# Patient Record
Sex: Female | Born: 1965 | Race: Black or African American | Hispanic: No | Smoking: Current every day smoker
Health system: Southern US, Community
[De-identification: ages and names within clinical notes are randomized; demographics above are authoritative.]

## PROBLEM LIST (undated history)

## (undated) DIAGNOSIS — E785 Hyperlipidemia, unspecified: Secondary | ICD-10-CM

## (undated) DIAGNOSIS — Z972 Presence of dental prosthetic device (complete) (partial): Secondary | ICD-10-CM

## (undated) DIAGNOSIS — F17200 Nicotine dependence, unspecified, uncomplicated: Secondary | ICD-10-CM

## (undated) DIAGNOSIS — Z9289 Personal history of other medical treatment: Secondary | ICD-10-CM

## (undated) DIAGNOSIS — M1990 Unspecified osteoarthritis, unspecified site: Secondary | ICD-10-CM

## (undated) DIAGNOSIS — T7840XA Allergy, unspecified, initial encounter: Secondary | ICD-10-CM

## (undated) DIAGNOSIS — Z01419 Encounter for gynecological examination (general) (routine) without abnormal findings: Secondary | ICD-10-CM

## (undated) DIAGNOSIS — I10 Essential (primary) hypertension: Secondary | ICD-10-CM

## (undated) DIAGNOSIS — Z973 Presence of spectacles and contact lenses: Secondary | ICD-10-CM

## (undated) DIAGNOSIS — D649 Anemia, unspecified: Secondary | ICD-10-CM

## (undated) DIAGNOSIS — C801 Malignant (primary) neoplasm, unspecified: Secondary | ICD-10-CM

## (undated) DIAGNOSIS — K219 Gastro-esophageal reflux disease without esophagitis: Secondary | ICD-10-CM

## (undated) DIAGNOSIS — E039 Hypothyroidism, unspecified: Secondary | ICD-10-CM

## (undated) DIAGNOSIS — Z8639 Personal history of other endocrine, nutritional and metabolic disease: Secondary | ICD-10-CM

## (undated) DIAGNOSIS — Z803 Family history of malignant neoplasm of breast: Secondary | ICD-10-CM

## (undated) DIAGNOSIS — R87619 Unspecified abnormal cytological findings in specimens from cervix uteri: Secondary | ICD-10-CM

## (undated) DIAGNOSIS — E782 Mixed hyperlipidemia: Secondary | ICD-10-CM

## (undated) DIAGNOSIS — R002 Palpitations: Secondary | ICD-10-CM

## (undated) HISTORY — DX: Allergy, unspecified, initial encounter: T78.40XA

## (undated) HISTORY — DX: Personal history of other medical treatment: Z92.89

## (undated) HISTORY — DX: Personal history of other endocrine, nutritional and metabolic disease: Z86.39

## (undated) HISTORY — DX: Presence of dental prosthetic device (complete) (partial): Z97.2

## (undated) HISTORY — DX: Presence of spectacles and contact lenses: Z97.3

## (undated) HISTORY — DX: Anemia, unspecified: D64.9

## (undated) HISTORY — DX: Hyperlipidemia, unspecified: E78.5

## (undated) HISTORY — DX: Nicotine dependence, unspecified, uncomplicated: F17.200

## (undated) HISTORY — DX: Family history of malignant neoplasm of breast: Z80.3

## (undated) HISTORY — PX: SHOULDER SURGERY: SHX246

## (undated) HISTORY — PX: FACIAL RECONSTRUCTION SURGERY: SHX631

## (undated) HISTORY — DX: Palpitations: R00.2

## (undated) HISTORY — DX: Unspecified abnormal cytological findings in specimens from cervix uteri: R87.619

## (undated) HISTORY — DX: Hypothyroidism, unspecified: E03.9

## (undated) HISTORY — DX: Gastro-esophageal reflux disease without esophagitis: K21.9

## (undated) HISTORY — DX: Mixed hyperlipidemia: E78.2

## (undated) HISTORY — DX: Unspecified osteoarthritis, unspecified site: M19.90

## (undated) HISTORY — DX: Encounter for gynecological examination (general) (routine) without abnormal findings: Z01.419

---

## 2008-01-02 ENCOUNTER — Emergency Department (HOSPITAL_COMMUNITY): Admission: EM | Admit: 2008-01-02 | Discharge: 2008-01-02 | Payer: Self-pay | Admitting: Emergency Medicine

## 2008-06-18 ENCOUNTER — Emergency Department (HOSPITAL_COMMUNITY): Admission: EM | Admit: 2008-06-18 | Discharge: 2008-06-18 | Payer: Self-pay | Admitting: Emergency Medicine

## 2008-08-26 ENCOUNTER — Encounter (INDEPENDENT_AMBULATORY_CARE_PROVIDER_SITE_OTHER): Payer: Self-pay | Admitting: Nurse Practitioner

## 2008-10-07 ENCOUNTER — Encounter: Admission: RE | Admit: 2008-10-07 | Discharge: 2008-10-07 | Payer: Self-pay | Admitting: Family Medicine

## 2008-10-24 ENCOUNTER — Emergency Department (HOSPITAL_COMMUNITY): Admission: EM | Admit: 2008-10-24 | Discharge: 2008-10-24 | Payer: Self-pay | Admitting: Family Medicine

## 2008-11-07 ENCOUNTER — Encounter (INDEPENDENT_AMBULATORY_CARE_PROVIDER_SITE_OTHER): Payer: Self-pay | Admitting: Nurse Practitioner

## 2008-11-18 ENCOUNTER — Ambulatory Visit: Payer: Self-pay | Admitting: Internal Medicine

## 2008-11-18 ENCOUNTER — Encounter (INDEPENDENT_AMBULATORY_CARE_PROVIDER_SITE_OTHER): Payer: Self-pay | Admitting: Nurse Practitioner

## 2008-11-18 DIAGNOSIS — N63 Unspecified lump in unspecified breast: Secondary | ICD-10-CM | POA: Insufficient documentation

## 2008-11-18 DIAGNOSIS — J45909 Unspecified asthma, uncomplicated: Secondary | ICD-10-CM | POA: Insufficient documentation

## 2008-11-18 DIAGNOSIS — Z8639 Personal history of other endocrine, nutritional and metabolic disease: Secondary | ICD-10-CM | POA: Insufficient documentation

## 2008-11-18 DIAGNOSIS — M6740 Ganglion, unspecified site: Secondary | ICD-10-CM | POA: Insufficient documentation

## 2008-11-18 DIAGNOSIS — E039 Hypothyroidism, unspecified: Secondary | ICD-10-CM | POA: Insufficient documentation

## 2008-11-18 DIAGNOSIS — F17200 Nicotine dependence, unspecified, uncomplicated: Secondary | ICD-10-CM | POA: Insufficient documentation

## 2008-11-20 DIAGNOSIS — D649 Anemia, unspecified: Secondary | ICD-10-CM | POA: Insufficient documentation

## 2008-11-20 LAB — CONVERTED CEMR LAB
ALT: 8 units/L (ref 0–35)
AST: 10 units/L (ref 0–37)
Albumin: 4.3 g/dL (ref 3.5–5.2)
Alkaline Phosphatase: 48 units/L (ref 39–117)
Basophils Absolute: 0 10*3/uL (ref 0.0–0.1)
Eosinophils Absolute: 0.1 10*3/uL (ref 0.0–0.7)
Eosinophils Relative: 2 % (ref 0–5)
Glucose, Bld: 94 mg/dL (ref 70–99)
HCT: 36.6 % (ref 36.0–46.0)
Lymphs Abs: 1.8 10*3/uL (ref 0.7–4.0)
MCV: 93.4 fL (ref 78.0–100.0)
Neutrophils Relative %: 58 % (ref 43–77)
Platelets: 175 10*3/uL (ref 150–400)
Potassium: 5 meq/L (ref 3.5–5.3)
RDW: 13.6 % (ref 11.5–15.5)
Sodium: 146 meq/L — ABNORMAL HIGH (ref 135–145)
Total Bilirubin: 0.3 mg/dL (ref 0.3–1.2)
Total Protein: 6.6 g/dL (ref 6.0–8.3)
WBC: 5.5 10*3/uL (ref 4.0–10.5)

## 2009-02-17 ENCOUNTER — Telehealth (INDEPENDENT_AMBULATORY_CARE_PROVIDER_SITE_OTHER): Payer: Self-pay | Admitting: Nurse Practitioner

## 2009-02-24 ENCOUNTER — Emergency Department (HOSPITAL_COMMUNITY): Admission: EM | Admit: 2009-02-24 | Discharge: 2009-02-24 | Payer: Self-pay | Admitting: Emergency Medicine

## 2009-03-24 ENCOUNTER — Encounter (INDEPENDENT_AMBULATORY_CARE_PROVIDER_SITE_OTHER): Payer: Self-pay | Admitting: Nurse Practitioner

## 2009-03-24 ENCOUNTER — Ambulatory Visit: Payer: Self-pay | Admitting: Nurse Practitioner

## 2009-03-24 ENCOUNTER — Other Ambulatory Visit: Admission: RE | Admit: 2009-03-24 | Discharge: 2009-03-24 | Payer: Self-pay | Admitting: Family Medicine

## 2009-03-24 DIAGNOSIS — N760 Acute vaginitis: Secondary | ICD-10-CM | POA: Insufficient documentation

## 2009-03-24 LAB — CONVERTED CEMR LAB
Bilirubin Urine: NEGATIVE
Blood in Urine, dipstick: NEGATIVE
Glucose, Urine, Semiquant: NEGATIVE
KOH Prep: NEGATIVE
Ketones, urine, test strip: NEGATIVE
Protein, U semiquant: NEGATIVE
Urobilinogen, UA: 0.2

## 2009-03-25 ENCOUNTER — Encounter (INDEPENDENT_AMBULATORY_CARE_PROVIDER_SITE_OTHER): Payer: Self-pay | Admitting: Nurse Practitioner

## 2009-03-25 LAB — CONVERTED CEMR LAB
ALT: 10 units/L (ref 0–35)
Albumin: 4.7 g/dL (ref 3.5–5.2)
Alkaline Phosphatase: 47 units/L (ref 39–117)
Basophils Absolute: 0 10*3/uL (ref 0.0–0.1)
Basophils Relative: 1 % (ref 0–1)
CO2: 22 meq/L (ref 19–32)
Chlamydia, DNA Probe: NEGATIVE
GC Probe Amp, Genital: NEGATIVE
Hemoglobin: 13.1 g/dL (ref 12.0–15.0)
LDL Cholesterol: 115 mg/dL — ABNORMAL HIGH (ref 0–99)
Lymphocytes Relative: 28 % (ref 12–46)
MCHC: 33 g/dL (ref 30.0–36.0)
Monocytes Relative: 8 % (ref 3–12)
Neutro Abs: 3.5 10*3/uL (ref 1.7–7.7)
Neutrophils Relative %: 62 % (ref 43–77)
Potassium: 4.4 meq/L (ref 3.5–5.3)
Preg, Serum: POSITIVE
RBC: 4.37 M/uL (ref 3.87–5.11)
Sodium: 141 meq/L (ref 135–145)
Total Bilirubin: 0.6 mg/dL (ref 0.3–1.2)
Total Protein: 7 g/dL (ref 6.0–8.3)
VLDL: 19 mg/dL (ref 0–40)

## 2009-03-26 ENCOUNTER — Encounter (INDEPENDENT_AMBULATORY_CARE_PROVIDER_SITE_OTHER): Payer: Self-pay | Admitting: Nurse Practitioner

## 2009-03-28 ENCOUNTER — Inpatient Hospital Stay (HOSPITAL_COMMUNITY): Admission: AD | Admit: 2009-03-28 | Discharge: 2009-03-28 | Payer: Self-pay | Admitting: Obstetrics & Gynecology

## 2009-03-30 ENCOUNTER — Inpatient Hospital Stay (HOSPITAL_COMMUNITY): Admission: AD | Admit: 2009-03-30 | Discharge: 2009-03-30 | Payer: Self-pay | Admitting: Family Medicine

## 2009-04-01 ENCOUNTER — Inpatient Hospital Stay (HOSPITAL_COMMUNITY): Admission: AD | Admit: 2009-04-01 | Discharge: 2009-04-01 | Payer: Self-pay | Admitting: Obstetrics & Gynecology

## 2009-04-11 ENCOUNTER — Inpatient Hospital Stay (HOSPITAL_COMMUNITY): Admission: RE | Admit: 2009-04-11 | Discharge: 2009-04-11 | Payer: Self-pay | Admitting: Family Medicine

## 2009-05-07 ENCOUNTER — Ambulatory Visit: Payer: Self-pay | Admitting: Obstetrics and Gynecology

## 2009-05-08 ENCOUNTER — Encounter: Payer: Self-pay | Admitting: Obstetrics and Gynecology

## 2009-05-15 ENCOUNTER — Ambulatory Visit: Payer: Self-pay | Admitting: Obstetrics and Gynecology

## 2009-05-16 ENCOUNTER — Ambulatory Visit (HOSPITAL_COMMUNITY): Admission: RE | Admit: 2009-05-16 | Discharge: 2009-05-16 | Payer: Self-pay | Admitting: Family Medicine

## 2009-05-16 ENCOUNTER — Encounter: Payer: Self-pay | Admitting: Obstetrics and Gynecology

## 2009-05-19 ENCOUNTER — Ambulatory Visit: Payer: Self-pay | Admitting: Obstetrics & Gynecology

## 2009-05-19 ENCOUNTER — Ambulatory Visit (HOSPITAL_COMMUNITY): Admission: RE | Admit: 2009-05-19 | Discharge: 2009-05-19 | Payer: Self-pay | Admitting: Obstetrics & Gynecology

## 2009-05-19 ENCOUNTER — Encounter: Payer: Self-pay | Admitting: Obstetrics & Gynecology

## 2009-05-21 ENCOUNTER — Encounter (INDEPENDENT_AMBULATORY_CARE_PROVIDER_SITE_OTHER): Payer: Self-pay | Admitting: Nurse Practitioner

## 2009-05-22 ENCOUNTER — Encounter: Admission: RE | Admit: 2009-05-22 | Discharge: 2009-05-22 | Payer: Self-pay | Admitting: Internal Medicine

## 2009-06-02 ENCOUNTER — Encounter (INDEPENDENT_AMBULATORY_CARE_PROVIDER_SITE_OTHER): Payer: Self-pay | Admitting: Nurse Practitioner

## 2009-06-02 DIAGNOSIS — O034 Incomplete spontaneous abortion without complication: Secondary | ICD-10-CM | POA: Insufficient documentation

## 2009-10-17 ENCOUNTER — Emergency Department (HOSPITAL_COMMUNITY): Admission: EM | Admit: 2009-10-17 | Discharge: 2009-10-17 | Payer: Self-pay | Admitting: Family Medicine

## 2009-10-24 ENCOUNTER — Encounter (INDEPENDENT_AMBULATORY_CARE_PROVIDER_SITE_OTHER): Payer: Self-pay | Admitting: Nurse Practitioner

## 2009-10-29 ENCOUNTER — Encounter: Admission: RE | Admit: 2009-10-29 | Discharge: 2009-10-29 | Payer: Self-pay | Admitting: Internal Medicine

## 2009-10-31 ENCOUNTER — Ambulatory Visit: Payer: Self-pay | Admitting: Nurse Practitioner

## 2009-10-31 DIAGNOSIS — B351 Tinea unguium: Secondary | ICD-10-CM | POA: Insufficient documentation

## 2009-10-31 DIAGNOSIS — N926 Irregular menstruation, unspecified: Secondary | ICD-10-CM | POA: Insufficient documentation

## 2009-11-17 LAB — CONVERTED CEMR LAB
ALT: 9 units/L (ref 0–35)
AST: 12 units/L (ref 0–37)
Alkaline Phosphatase: 46 units/L (ref 39–117)
Indirect Bilirubin: 0.4 mg/dL (ref 0.0–0.9)
Total Protein: 6.6 g/dL (ref 6.0–8.3)

## 2010-01-13 ENCOUNTER — Telehealth (INDEPENDENT_AMBULATORY_CARE_PROVIDER_SITE_OTHER): Payer: Self-pay | Admitting: Nurse Practitioner

## 2010-01-17 ENCOUNTER — Emergency Department (HOSPITAL_COMMUNITY): Admission: EM | Admit: 2010-01-17 | Discharge: 2010-01-17 | Payer: Self-pay | Admitting: Family Medicine

## 2010-01-19 ENCOUNTER — Encounter (INDEPENDENT_AMBULATORY_CARE_PROVIDER_SITE_OTHER): Payer: Self-pay | Admitting: *Deleted

## 2010-04-12 ENCOUNTER — Emergency Department (HOSPITAL_COMMUNITY): Admission: EM | Admit: 2010-04-12 | Discharge: 2010-04-12 | Payer: Self-pay | Admitting: Family Medicine

## 2010-05-20 IMAGING — US US OB TRANSVAGINAL
1 series · 14 of 26 positions shown · non-contrast
Comparison: none

OBSTETRICAL ULTRASOUND:
 This ultrasound exam was performed in the [HOSPITAL] Ultrasound Department.  The OB US report was generated in the AS system, and faxed to the ordering physician.  This report is also available in [REDACTED] PACS.

[Series 1: us ob comp less 14 wks · 0.12mm/px · 14 of 26 slices shown]
[im 1/26]
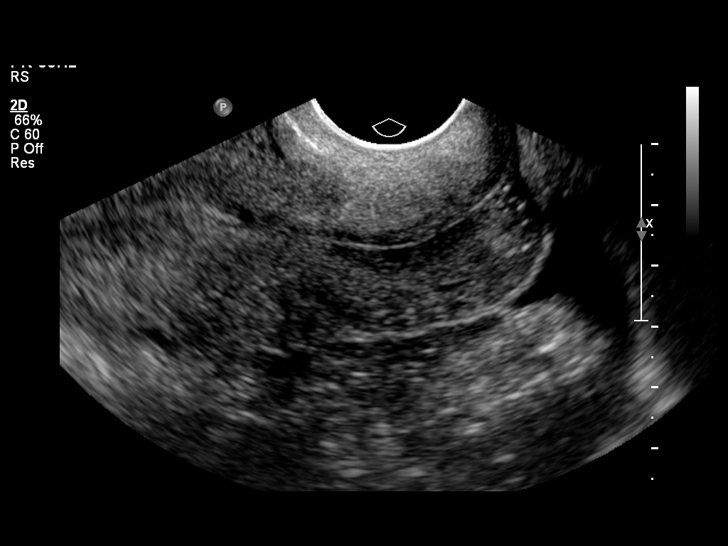
[im 3/26]
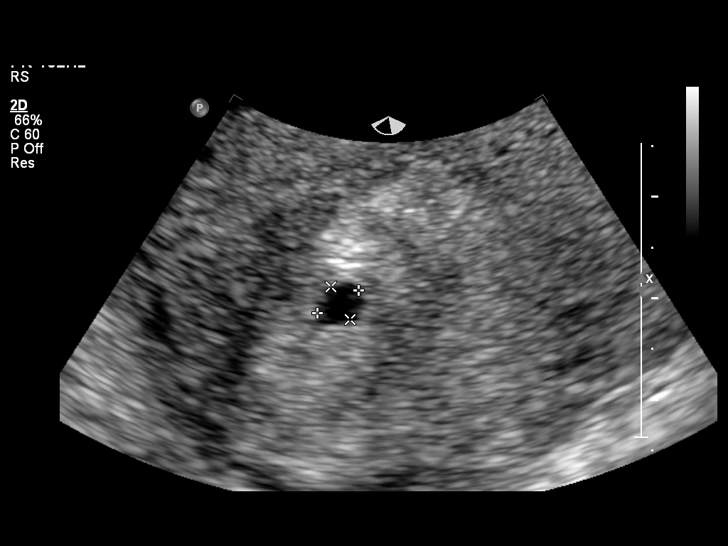
[im 5/26]
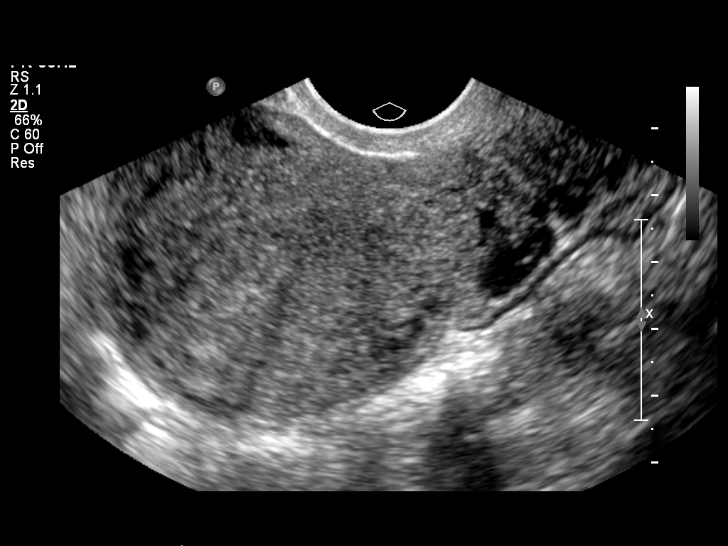
[im 7/26]
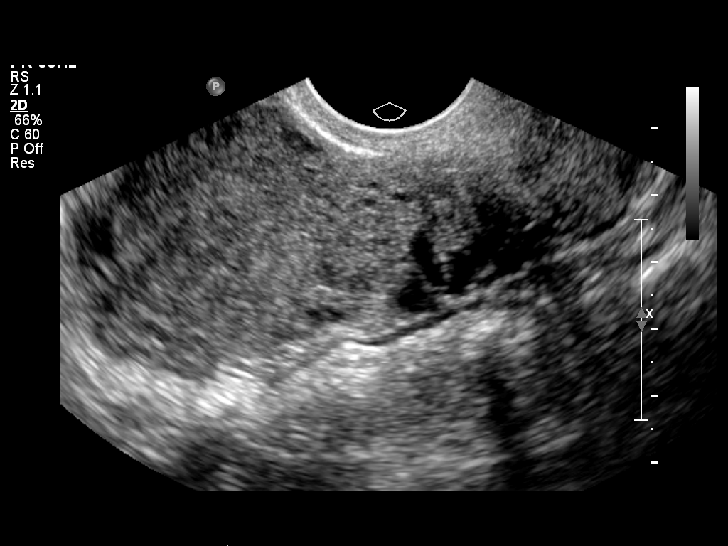
[im 9/26]
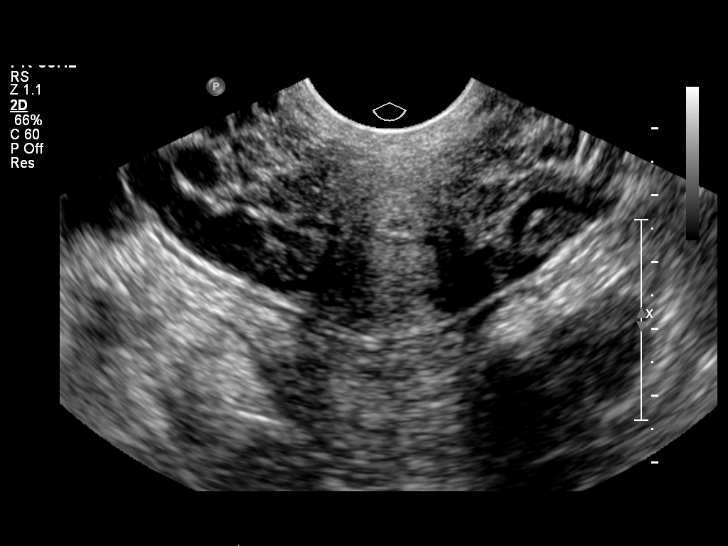
[im 11/26]
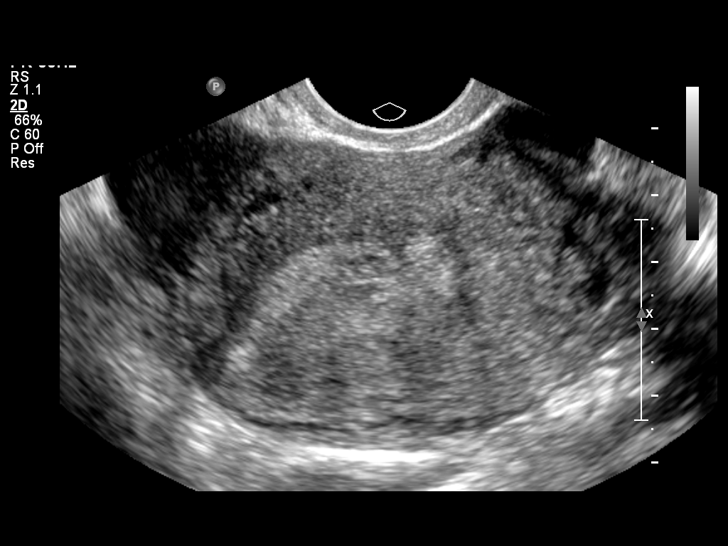
[im 13/26]
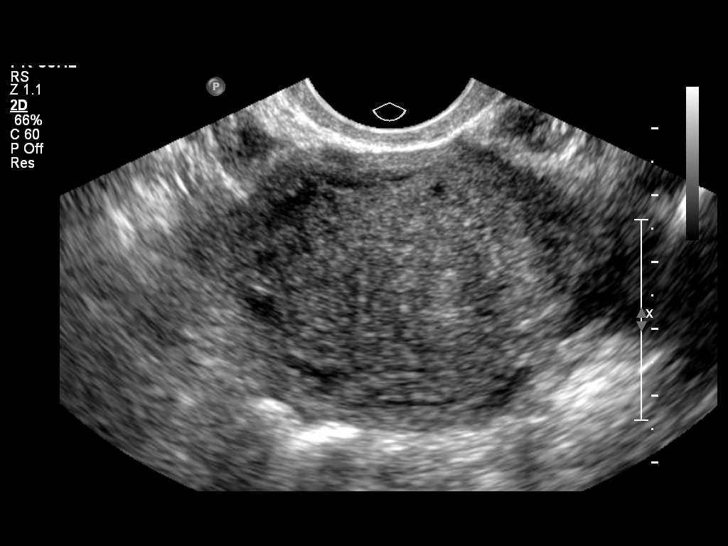
[im 14/26]
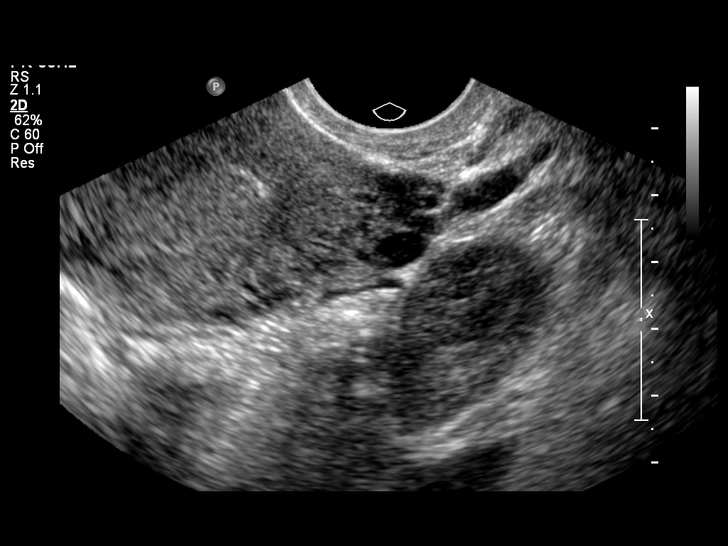
[im 16/26]
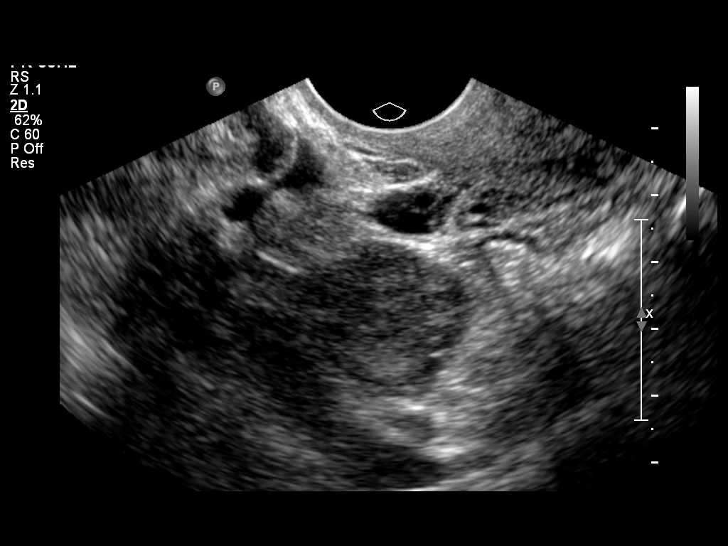
[im 18/26]
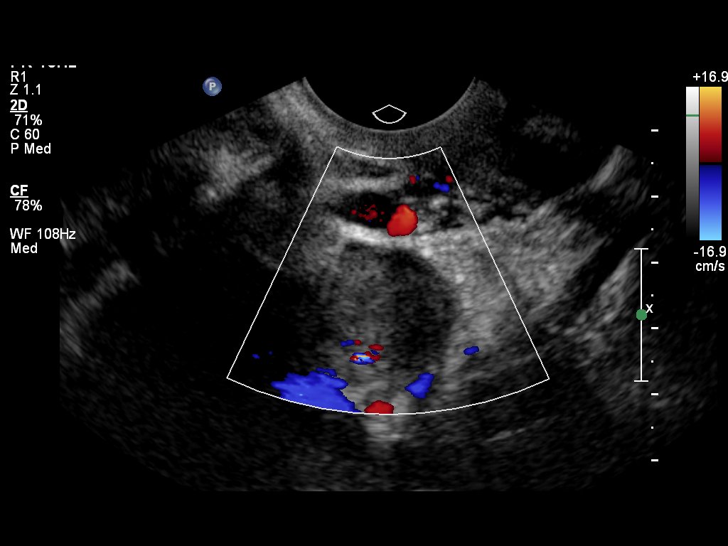
[im 20/26]
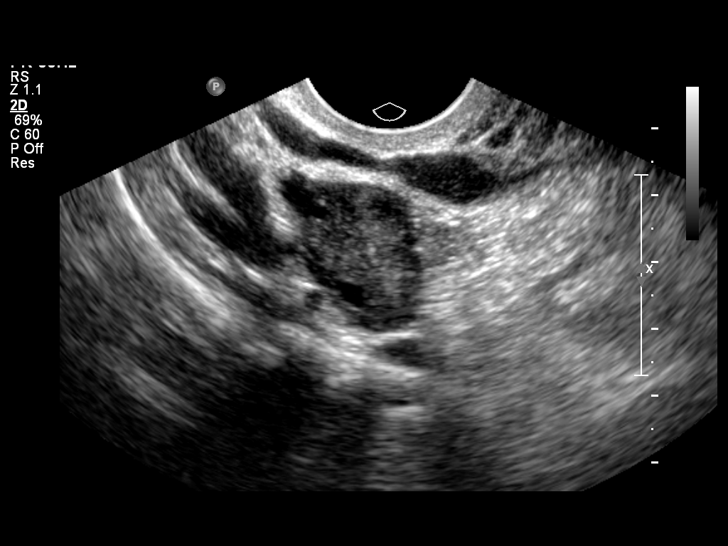
[im 22/26]
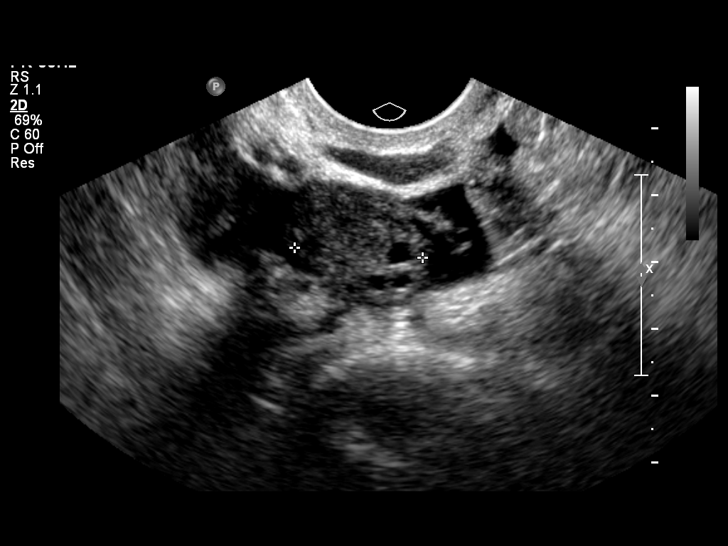
[im 24/26]
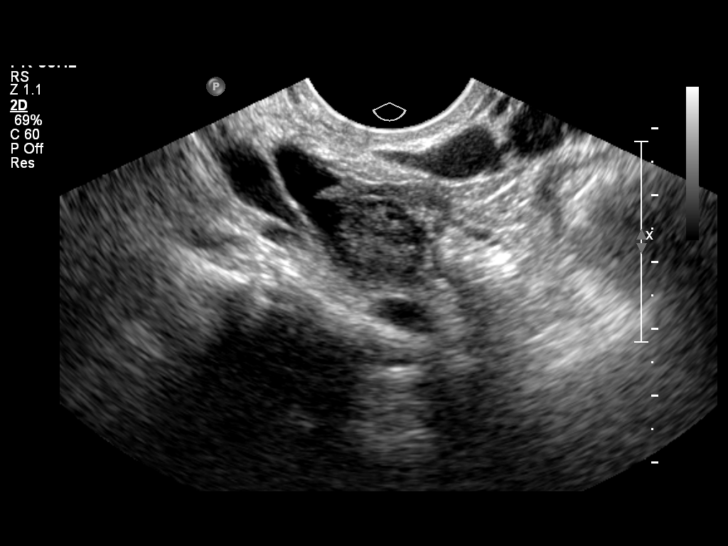
[im 26/26]
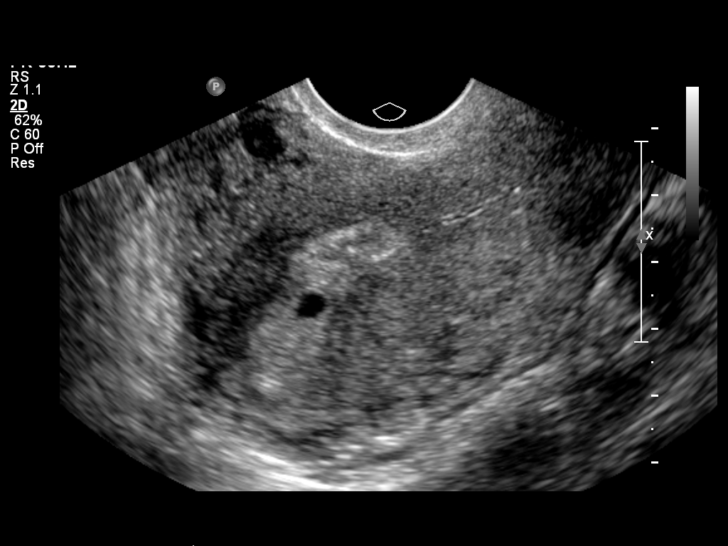

[14 of 26 positions shown; findings below may reference images not displayed]

IMPRESSION: See AS Obstetric US report.

## 2010-05-31 ENCOUNTER — Emergency Department (HOSPITAL_COMMUNITY): Admission: EM | Admit: 2010-05-31 | Discharge: 2010-05-31 | Payer: Self-pay | Admitting: Emergency Medicine

## 2010-06-17 ENCOUNTER — Telehealth (INDEPENDENT_AMBULATORY_CARE_PROVIDER_SITE_OTHER): Payer: Self-pay | Admitting: Nurse Practitioner

## 2010-06-18 ENCOUNTER — Ambulatory Visit: Payer: Self-pay | Admitting: Nurse Practitioner

## 2010-06-22 ENCOUNTER — Telehealth (INDEPENDENT_AMBULATORY_CARE_PROVIDER_SITE_OTHER): Payer: Self-pay | Admitting: Nurse Practitioner

## 2010-06-22 ENCOUNTER — Encounter (INDEPENDENT_AMBULATORY_CARE_PROVIDER_SITE_OTHER): Payer: Self-pay | Admitting: Nurse Practitioner

## 2010-10-10 ENCOUNTER — Emergency Department (HOSPITAL_COMMUNITY): Admission: EM | Admit: 2010-10-10 | Discharge: 2010-10-10 | Payer: Self-pay | Admitting: Emergency Medicine

## 2010-11-29 ENCOUNTER — Emergency Department (HOSPITAL_COMMUNITY)
Admission: EM | Admit: 2010-11-29 | Discharge: 2010-11-29 | Payer: Self-pay | Source: Home / Self Care | Admitting: Emergency Medicine

## 2010-12-15 ENCOUNTER — Emergency Department (HOSPITAL_COMMUNITY)
Admission: EM | Admit: 2010-12-15 | Discharge: 2010-12-15 | Payer: Self-pay | Source: Home / Self Care | Admitting: Emergency Medicine

## 2010-12-22 ENCOUNTER — Emergency Department (HOSPITAL_COMMUNITY)
Admission: EM | Admit: 2010-12-22 | Discharge: 2010-12-22 | Payer: Self-pay | Source: Home / Self Care | Admitting: Family Medicine

## 2011-01-17 ENCOUNTER — Encounter: Payer: Self-pay | Admitting: Internal Medicine

## 2011-01-19 ENCOUNTER — Telehealth (INDEPENDENT_AMBULATORY_CARE_PROVIDER_SITE_OTHER): Payer: Self-pay | Admitting: Nurse Practitioner

## 2011-01-26 NOTE — Progress Notes (Signed)
Summary: LAB RESULTS /NEEDS REFILLS  Phone Note Call from Patient   Reason for Call: Refill Medication, Lab or Test Results Summary of Call: PT WANTS TO KNOW HER LAB RESULTS .Megan Bullock, CALL PT [phone] THANK YOU  Initial call taken by: Cheryll Dessert,  June 22, 2010 3:01 PM  Follow-up for Phone Call        Megan Bullock CALLED AND SAYS THAT SHE WILL NEED A REFILL ON HER LEXOXYL AND VENTOLIN, CALLED INTO [facility] ON [address], BECAUSE HER MEDICAID RUNS OUT TODAY. Follow-up by: Leodis Rains,  June 25, 2010 10:23 AM  Additional Follow-up for Phone Call Additional follow up Details #1::        levoxyl last filled 02/09/10 ventolin last filled 10/31/2009 Forward to N. Daphine Deutscher, fnp Additional Follow-up by: Levon Hedger,  June 25, 2010 12:14 PM    Additional Follow-up for Phone Call Additional follow up Details #2::    Rx sent to pharmacy notify pt to check there Follow-up by: Lehman Prom FNP,  June 25, 2010 1:07 PM  Additional Follow-up for Phone Call Additional follow up Details #3:: Details for Additional Follow-up Action Taken: pt informed. Additional Follow-up by: Levon Hedger,  July 01, 2010 11:53 AM  New/Updated Medications: VENTOLIN HFA 108 (90 BASE) MCG/ACT AERS (ALBUTEROL SULFATE) Two inhalations every 6 hours as needed for shortness of breath LEVOXYL 150 MCG TABS (LEVOTHYROXINE SODIUM) One tablet by mouth daily for thyroid  Brand name is medically necessary [BMN] Prescriptions: VENTOLIN HFA 108 (90 BASE) MCG/ACT AERS (ALBUTEROL SULFATE) Two inhalations every 6 hours as needed for shortness of breath  #1 x 3   Entered and Authorized by:   Lehman Prom FNP   Signed by:   Lehman Prom FNP on 06/25/2010   Method used:   Electronically to        [facility] [address] [phone]* (retail)       [address]       [place], [place]  [address]       Ph: [phone] or [phone]       Fax: [phone]   RxID:   [id number] LEVOXYL  150 MCG TABS (LEVOTHYROXINE SODIUM) One tablet by mouth daily for thyroid  Brand name is medically necessary Brand medically necessary #30 x 5   Entered and Authorized by:   Lehman Prom FNP   Signed by:   Lehman Prom FNP on 06/25/2010   Method used:   Electronically to        [facility] [address] [phone]* (retail)       [address]       [place], [place]  [address]       Ph: [phone] or [phone]       Fax: [phone]   RxID:   [id number]

## 2011-01-26 NOTE — Letter (Signed)
Summary: *HSN Results Follow up  [facility]  [address] [place], [place] [address]   Phone: [phone]  Fax: [phone]      01/19/2010   [facility] [place] [address] [place], [place]  [address]   Dear  Ms. Rily Kruk,                            ____S.Drinkard,FNP   ____D. Gore,FNP       ____B. McPherson,MD   ____V. Rankins,MD    ____E. Mulberry,MD    ____N. Daphine Deutscher, FNP  ____D. Reche Dixon, MD    ____K. Philipp Deputy, MD    ____Other     This letter is to inform you that your recent test(s):  _______Pap Smear    _______Lab Test     _______X-ray    _______ is within acceptable limits  _______ requires a medication change  _______ requires a follow-up lab visit  _______ requires a follow-up visit with your provider   Comments:  We have tried contacting you at [phone] about your thyroid lab results.  Please contact the office at your earliest convenience.       _________________________________________________________ If you have any questions, please contact our office                     Sincerely,  Levon Hedger [facility]

## 2011-01-26 NOTE — Letter (Signed)
Summary: *HSN Results Follow up  [facility]  [address] [place], [place] [address]   Phone: [phone]  Fax: [phone]      06/22/2010   [facility] [place] [address] [place], [place]  [address]   Dear  Ms. Madalyn Swartzlander,                            ____S.Drinkard,FNP   ____D. Gore,FNP       ____B. McPherson,MD   ____V. Rankins,MD    ____E. Mulberry,MD    __X__N. Daphine Deutscher, FNP  ____D. Reche Dixon, MD    ____K. Philipp Deputy, MD    ____Other     This letter is to inform you that your recent test(s):  _______Pap Smear    ____X___Lab Test     _______X-ray    ___X____ is within acceptable limits  _______ requires a medication change  _______ requires a follow-up lab visit  _______ requires a follow-up visit with your provider   Comments:  Thyroid labs ok, continue current medication.       _________________________________________________________ If you have any questions, please contact our office [phone].                    Sincerely,    Lehman Prom FNP [facility]

## 2011-01-26 NOTE — Progress Notes (Signed)
Summary: ? about labs  Phone Note Other Incoming   Summary of Call: Pt has requested refills on thyroid meds.  will not refill. This office tried to reach pt back in 10/2009 after labs.  she was mailed a letter but I don't see she every called back.   re-attempt to reach pt give info in last phone note Initial call taken by: Lehman Prom FNP,  January 13, 2010 10:27 AM  Follow-up for Phone Call        left message on answer machine for pt. to return call  Gaylyn Cheers RN  January 13, 2010 1:17 PM   Levon Hedger  January 15, 2010 12:19 AM Left message on machine for pt to return call to the office.  Levon Hedger  January 19, 2010 2:21 PM Left message at [phone] for pt to return call to the office.  Will Astronomer.

## 2011-01-26 NOTE — Progress Notes (Signed)
Summary: Needs a lab test before her insurance expired (June 30,2011)  Phone Note Call from Patient Call back at [phone]   Summary of Call: The pt needs to do a thyroid lab test but it needs to be before her insurance runs out (June 25, 2010) [facility] Initial call taken by: Manon Hilding,  June 17, 2010 10:24 AM  Follow-up for Phone Call        last TSH drawn was 10/31/09 forward to N. Daphine Deutscher, FNP Follow-up by: Levon Hedger,  June 17, 2010 11:18 AM  Additional Follow-up for Phone Call Additional follow up Details #1::        CPE is past due - Is there any room for a CPE in my schedule before June 30th? If not, yes pt can get lab visit for TSH as last level was very abnormal Additional Follow-up by: Lehman Prom FNP,  June 17, 2010 12:42 PM    Additional Follow-up for Phone Call Additional follow up Details #2::    Pt. in for labs 06/18/10.  Dutch Quint RN  June 18, 2010 4:34 PM

## 2011-01-28 NOTE — Progress Notes (Signed)
Summary: NEEDS MAMOGRAM REF  Phone Note Call from Patient Call back at Home Phone [phone]   Reason for Call: Referral Summary of Call: MARTIBN PT. MS [place] GTRIED TO CALL THE [facility] ON [address] TO GET HER MAMOGRAM APPT. AND THEY TOLD HER THAT SHE NEEDS A REFERRAL FROM [place]. Initial call taken by: Leodis Rains,  January 19, 2011 9:31 AM  Follow-up for Phone Call        Sent to N. Daphine Deutscher.  Dutch Quint RN  January 19, 2011 3:31 PM   Additional Follow-up for Phone Call Additional follow up Details #1::        pt has not been seen in this office since 2010 she needs an office visit before authorizing any tests.  she can schedule cpe with fasting labs and pap, mammogram will be ordered Additional Follow-up by: Lehman Prom FNP,  January 19, 2011 6:02 PM    Additional Follow-up for Phone Call Additional follow up Details #2::    Left message on voicemail for pt. to return call.  Dutch Quint RN  January 20, 2011 11:24 AM  Left message on voicemail for pt. to return call.  Dutch Quint RN  January 21, 2011 10:57 AM  Pt. advised of provider's response -- to call back for CPP.  Dutch Quint RN  January 22, 2011 4:17 PM   `

## 2011-02-05 ENCOUNTER — Ambulatory Visit (INDEPENDENT_AMBULATORY_CARE_PROVIDER_SITE_OTHER): Payer: 59 | Admitting: Family Medicine

## 2011-02-05 DIAGNOSIS — E039 Hypothyroidism, unspecified: Secondary | ICD-10-CM

## 2011-02-05 DIAGNOSIS — Z803 Family history of malignant neoplasm of breast: Secondary | ICD-10-CM

## 2011-02-05 DIAGNOSIS — Z0000 Encounter for general adult medical examination without abnormal findings: Secondary | ICD-10-CM

## 2011-02-05 DIAGNOSIS — J45909 Unspecified asthma, uncomplicated: Secondary | ICD-10-CM

## 2011-02-08 ENCOUNTER — Other Ambulatory Visit: Payer: Self-pay | Admitting: Family Medicine

## 2011-02-08 DIAGNOSIS — I671 Cerebral aneurysm, nonruptured: Secondary | ICD-10-CM

## 2011-02-15 ENCOUNTER — Encounter (INDEPENDENT_AMBULATORY_CARE_PROVIDER_SITE_OTHER): Payer: Self-pay | Admitting: Nurse Practitioner

## 2011-02-17 ENCOUNTER — Ambulatory Visit
Admission: RE | Admit: 2011-02-17 | Discharge: 2011-02-17 | Disposition: A | Discharge status: Home / Self Care | Payer: 59 | Source: Ambulatory Visit | Attending: Family Medicine | Admitting: Family Medicine

## 2011-02-17 ENCOUNTER — Inpatient Hospital Stay: Admission: RE | Admit: 2011-02-17 | Payer: 59 | Source: Ambulatory Visit

## 2011-02-17 ENCOUNTER — Other Ambulatory Visit: Payer: Self-pay | Admitting: Family Medicine

## 2011-02-17 DIAGNOSIS — I729 Aneurysm of unspecified site: Secondary | ICD-10-CM

## 2011-02-17 DIAGNOSIS — I671 Cerebral aneurysm, nonruptured: Secondary | ICD-10-CM

## 2011-02-17 MED ORDER — GADOBENATE DIMEGLUMINE 529 MG/ML IV SOLN
15.0000 mL | Freq: Once | INTRAVENOUS | Status: AC | PRN
  Administered 2011-02-17: 15 mL via INTRAVENOUS

## 2011-02-19 ENCOUNTER — Ambulatory Visit (INDEPENDENT_AMBULATORY_CARE_PROVIDER_SITE_OTHER): Payer: 59 | Admitting: Family Medicine

## 2011-02-19 DIAGNOSIS — J309 Allergic rhinitis, unspecified: Secondary | ICD-10-CM

## 2011-02-19 DIAGNOSIS — E039 Hypothyroidism, unspecified: Secondary | ICD-10-CM

## 2011-02-19 DIAGNOSIS — B86 Scabies: Secondary | ICD-10-CM

## 2011-02-23 NOTE — Letter (Signed)
Summary: MAILED REQUESTED RECORDS TO [facility]  MAILED REQUESTED RECORDS TO [facility]   Imported By: Arta Bruce 02/15/2011 13:48:51  _____________________________________________________________________  External Attachment:    Type:   Image     Comment:   External Document

## 2011-03-08 LAB — TSH: TSH: 0.702 u[IU]/mL (ref 0.350–4.500)

## 2011-03-10 LAB — DIFFERENTIAL
Basophils Relative: 1 % (ref 0–1)
Lymphocytes Relative: 41 % (ref 12–46)
Lymphs Abs: 1.8 10*3/uL (ref 0.7–4.0)
Monocytes Relative: 5 % (ref 3–12)
Neutro Abs: 2.2 10*3/uL (ref 1.7–7.7)
Neutrophils Relative %: 51 % (ref 43–77)

## 2011-03-10 LAB — TSH: TSH: 0.443 u[IU]/mL (ref 0.350–4.500)

## 2011-03-10 LAB — URINALYSIS, ROUTINE W REFLEX MICROSCOPIC
Hgb urine dipstick: NEGATIVE
Nitrite: NEGATIVE
Protein, ur: NEGATIVE mg/dL
Urobilinogen, UA: 0.2 mg/dL (ref 0.0–1.0)

## 2011-03-10 LAB — COMPREHENSIVE METABOLIC PANEL
ALT: 10 U/L (ref 0–35)
Alkaline Phosphatase: 60 U/L (ref 39–117)
Chloride: 110 mEq/L (ref 96–112)
Glucose, Bld: 105 mg/dL — ABNORMAL HIGH (ref 70–99)
Potassium: 3.9 mEq/L (ref 3.5–5.1)
Sodium: 141 mEq/L (ref 135–145)
Total Protein: 6.2 g/dL (ref 6.0–8.3)

## 2011-03-10 LAB — CBC
HCT: 36.8 % (ref 36.0–46.0)
Hemoglobin: 12.4 g/dL (ref 12.0–15.0)
RBC: 4 MIL/uL (ref 3.87–5.11)
RDW: 14.1 % (ref 11.5–15.5)
WBC: 4.4 10*3/uL (ref 4.0–10.5)

## 2011-03-10 LAB — T4: T4, Total: 11.7 ug/dL (ref 5.0–12.5)

## 2011-03-10 LAB — LIPASE, BLOOD: Lipase: 22 U/L (ref 11–59)

## 2011-03-10 LAB — PREGNANCY, URINE: Preg Test, Ur: NEGATIVE

## 2011-03-30 ENCOUNTER — Ambulatory Visit (INDEPENDENT_AMBULATORY_CARE_PROVIDER_SITE_OTHER): Payer: 59 | Admitting: Family Medicine

## 2011-03-30 DIAGNOSIS — G500 Trigeminal neuralgia: Secondary | ICD-10-CM

## 2011-04-06 LAB — CBC
HCT: 34.5 % — ABNORMAL LOW (ref 36.0–46.0)
MCV: 93.6 fL (ref 78.0–100.0)
Platelets: 137 10*3/uL — ABNORMAL LOW (ref 150–400)
WBC: 4.8 10*3/uL (ref 4.0–10.5)

## 2011-04-07 LAB — URINALYSIS, ROUTINE W REFLEX MICROSCOPIC
Glucose, UA: NEGATIVE mg/dL
Ketones, ur: NEGATIVE mg/dL
Protein, ur: NEGATIVE mg/dL

## 2011-04-07 LAB — CBC
HCT: 34.4 % — ABNORMAL LOW (ref 36.0–46.0)
MCV: 95.2 fL (ref 78.0–100.0)
Platelets: 122 10*3/uL — ABNORMAL LOW (ref 150–400)
RDW: 14.1 % (ref 11.5–15.5)

## 2011-04-07 LAB — HCG, QUANTITATIVE, PREGNANCY
hCG, Beta Chain, Quant, S: 11188 m[IU]/mL — ABNORMAL HIGH (ref <5)
hCG, Beta Chain, Quant, S: 7299 m[IU]/mL — ABNORMAL HIGH (ref <5)

## 2011-04-08 LAB — POCT PREGNANCY, URINE: Preg Test, Ur: POSITIVE

## 2011-04-29 ENCOUNTER — Ambulatory Visit (INDEPENDENT_AMBULATORY_CARE_PROVIDER_SITE_OTHER): Payer: 59 | Admitting: Family Medicine

## 2011-04-29 DIAGNOSIS — Z7189 Other specified counseling: Secondary | ICD-10-CM

## 2011-04-29 DIAGNOSIS — F17200 Nicotine dependence, unspecified, uncomplicated: Secondary | ICD-10-CM

## 2011-05-11 NOTE — Op Note (Signed)
Megan Bullock, PICHETTE                 ACCOUNT NO.:  [id number]   MEDICAL RECORD NO.:  [id number]         PATIENT TYPE:  WAMB   LOCATION:                                FACILITY:  [facility]   PHYSICIAN:  Scheryl Darter, MD       DATE OF BIRTH:  [birth year]   DATE OF PROCEDURE:  DATE OF DISCHARGE:                               OPERATIVE REPORT   PROCEDURE:  Suction dilatation and curettage.   PREOPERATIVE DIAGNOSIS:  Incomplete abortion.   POSTOPERATIVE DIAGNOSIS:  Incomplete abortion.   SURGEON:  Scheryl Darter, MD   ANESTHESIA:  General.   ESTIMATED BLOOD LOSS:  Negligible.   SPECIMEN:  Products of conception.   COMPLICATIONS:  None.   DRAINS:  None.   COUNTS:  Correct.   HOSPITAL COURSE:  The patient gave written consent for suction  dilatation and curettage due to incomplete abortion after treatment with  Cytotec.  The patient identification was confirmed.  She was brought to  the OR and general anesthesia was induced.  She was placed in dorsal  lithotomy position.  Perineum and vagina was sterilely prepped and  draped.  Bladder was drained with red rubber catheter.  Exam revealed  about a 4 to 6 weeks sized uterus.  No adnexal masses.  The speculum was  inserted and the cervix was grasped with single-tooth tenaculum.  Uterus  sounded at 9 cm.  Cervix was dilated sufficiently to pass a 9-mm suction  curette.  Suction curettage was obtained and products of conception were  taken for pathology.  The evacuation of the uterine cavity was assured.  The patient received IV Pitocin during the procedure.  The instruments  were removed.  There was minimal bleeding at the end of procedure.  The  patient tolerated the procedure well without complications.  She was  brought in stable condition to recovery room.      Scheryl Darter, MD  Electronically Signed     JA/MEDQ  D:  05/19/2009  T:  05/20/2009  Job:  [id number]

## 2011-05-11 NOTE — Group Therapy Note (Signed)
[name], [name]                 ACCOUNT NO.:  [id number]   MEDICAL RECORD NO.:  [id number]          PATIENT TYPE:  WOC   LOCATION:  [facility]                   FACILITY:  [facility]   PHYSICIAN:  Argentina Donovan, MD        DATE OF BIRTH:  October 22, 1966   DATE OF SERVICE:  05/15/2009                                  CLINIC NOTE   The patient is a 45 year old African American female who went into the  maternity admissions office on April 2 because of some vaginal spotting.  She had a beta HCG of 4945, on April 4 of 7995, and on April 6 11188.  She was told that she had a missed AB and was treated with Cytotec on  April 16. She waited a few days, nothing has happened except the  bleeding got heavier. She came into the clinic here on May 12 and had  another quantitative beta which showed a 4014 level.  I am going to get  another beta today, get another ultrasound.  I think this patient  probably needs a D and C for missed abortion.  She will be able to get  an ultrasound tomorrow and then we will let her know and have them set  her up for a D and C.   IMPRESSION:  Missed abortion.           ______________________________  Argentina Donovan, MD     PR/MEDQ  D:  05/15/2009  T:  05/15/2009  Job:  [id number]

## 2011-05-29 ENCOUNTER — Encounter: Payer: Self-pay | Admitting: Family Medicine

## 2011-05-29 DIAGNOSIS — Z803 Family history of malignant neoplasm of breast: Secondary | ICD-10-CM | POA: Insufficient documentation

## 2011-06-01 ENCOUNTER — Ambulatory Visit (INDEPENDENT_AMBULATORY_CARE_PROVIDER_SITE_OTHER): Payer: 59 | Admitting: Family Medicine

## 2011-06-01 ENCOUNTER — Encounter: Payer: Self-pay | Admitting: Family Medicine

## 2011-06-01 VITALS — BP 120/80 | HR 72 | Wt 187.0 lb

## 2011-06-01 DIAGNOSIS — J302 Other seasonal allergic rhinitis: Secondary | ICD-10-CM | POA: Insufficient documentation

## 2011-06-01 DIAGNOSIS — J309 Allergic rhinitis, unspecified: Secondary | ICD-10-CM

## 2011-06-01 DIAGNOSIS — F17200 Nicotine dependence, unspecified, uncomplicated: Secondary | ICD-10-CM

## 2011-06-01 MED ORDER — FLUTICASONE PROPIONATE 50 MCG/ACT NA SUSP: 2.0000 | Freq: Every day | NASAL | Status: DC

## 2011-06-01 MED ORDER — VARENICLINE TARTRATE 1 MG PO TABS: 1.0000 mg | ORAL_TABLET | Freq: Two times a day (BID) | ORAL | Status: DC

## 2011-06-01 NOTE — Progress Notes (Signed)
  Subjective:    Patient ID: Megan Bullock, female    DOB: 1966-02-25, 45 y.o.   MRN: [id number]  HPI She is here for recheck on her smoking. She has done quite well and only had one episode where does have a smoking she ate ice cream. At that time with dealing with her daughter who was been difficult to deal with. She also has allergy symptoms mainly of sinus congestion sneezing, itchy watery eyes. She has been on Flonase in the past and will be placed back on  Review of Systems     Objective:   Physical Exam Alert and in no distress otherwise not examined       Assessment & Plan:  Walking consult. Allergic rhinitis. Continue Chantix and I will give her Flonase. Recheck here in one month

## 2011-06-01 NOTE — Patient Instructions (Signed)
use the Flonase regularly through the summer months. Continue on the Chantix and remember to think positive for throat and what he can't do. Recheck here one month

## 2011-07-08 ENCOUNTER — Ambulatory Visit: Payer: 59 | Admitting: Family Medicine

## 2011-07-09 ENCOUNTER — Ambulatory Visit (INDEPENDENT_AMBULATORY_CARE_PROVIDER_SITE_OTHER): Payer: 59 | Admitting: Family Medicine

## 2011-07-09 ENCOUNTER — Encounter: Payer: Self-pay | Admitting: Family Medicine

## 2011-07-09 VITALS — BP 130/90 | HR 70 | Wt 178.0 lb

## 2011-07-09 DIAGNOSIS — F17200 Nicotine dependence, unspecified, uncomplicated: Secondary | ICD-10-CM

## 2011-07-09 DIAGNOSIS — L918 Other hypertrophic disorders of the skin: Secondary | ICD-10-CM

## 2011-07-09 DIAGNOSIS — E039 Hypothyroidism, unspecified: Secondary | ICD-10-CM

## 2011-07-09 DIAGNOSIS — L909 Atrophic disorder of skin, unspecified: Secondary | ICD-10-CM

## 2011-07-09 DIAGNOSIS — Z559 Problems related to education and literacy, unspecified: Secondary | ICD-10-CM

## 2011-07-09 MED ORDER — VARENICLINE TARTRATE 1 MG PO TABS: 1.0000 mg | ORAL_TABLET | Freq: Two times a day (BID) | ORAL | Status: AC

## 2011-07-09 MED ORDER — LEVOTHYROXINE SODIUM 150 MCG PO TABS: 150.0000 ug | ORAL_TABLET | Freq: Every day | ORAL | Status: DC

## 2011-07-09 NOTE — Progress Notes (Signed)
  Subjective:    Patient ID: Megan Bullock, female    DOB: Dec 19, 1966, 45 y.o.   MRN: [id number]  HPI She is here for multiple skin tag removal from her neck. She also is here for smoking consultation. She is on Chantix and did have one relapse due to stress. She seems to have a better handle on how to handle stress at this time. She also would like a renewal on her thyroid medication she has had recent blood work which was good.   Review of Systems     Objective:   Physical Exam Alert and in no distress. Multiple pigmented skin tags are noted on both sides of the neck.       Assessment & Plan:  Smoking consult. Skin tags. Approximately 12 skin tags were removed from both sides the neck without difficulty. We also discussed stress and stress management with her again. I will renew her Chantix and encouraged her to continue to work on positive ways to handle stress.

## 2011-07-09 NOTE — Patient Instructions (Signed)
Stay on the Chantix. Return here in one month for recheck.

## 2011-08-11 ENCOUNTER — Ambulatory Visit: Payer: 59 | Admitting: Family Medicine

## 2011-09-18 ENCOUNTER — Emergency Department (HOSPITAL_COMMUNITY)
Admission: EM | Admit: 2011-09-18 | Discharge: 2011-09-19 | Disposition: A | Discharge status: Home / Self Care | Payer: 59 | Attending: Emergency Medicine | Admitting: Emergency Medicine

## 2011-09-18 DIAGNOSIS — H5710 Ocular pain, unspecified eye: Secondary | ICD-10-CM | POA: Insufficient documentation

## 2011-09-18 DIAGNOSIS — X58XXXA Exposure to other specified factors, initial encounter: Secondary | ICD-10-CM | POA: Insufficient documentation

## 2011-09-18 DIAGNOSIS — J45909 Unspecified asthma, uncomplicated: Secondary | ICD-10-CM | POA: Insufficient documentation

## 2011-09-18 DIAGNOSIS — E0500 Thyrotoxicosis with diffuse goiter without thyrotoxic crisis or storm: Secondary | ICD-10-CM | POA: Insufficient documentation

## 2011-09-18 DIAGNOSIS — E039 Hypothyroidism, unspecified: Secondary | ICD-10-CM | POA: Insufficient documentation

## 2011-09-18 DIAGNOSIS — S058X9A Other injuries of unspecified eye and orbit, initial encounter: Secondary | ICD-10-CM | POA: Insufficient documentation

## 2011-09-18 DIAGNOSIS — H53149 Visual discomfort, unspecified: Secondary | ICD-10-CM | POA: Insufficient documentation

## 2011-11-01 ENCOUNTER — Ambulatory Visit (INDEPENDENT_AMBULATORY_CARE_PROVIDER_SITE_OTHER): Payer: 59 | Admitting: Family Medicine

## 2011-11-01 ENCOUNTER — Encounter: Payer: Self-pay | Admitting: Family Medicine

## 2011-11-01 VITALS — BP 122/82 | HR 100 | Wt 184.0 lb

## 2011-11-01 DIAGNOSIS — F17200 Nicotine dependence, unspecified, uncomplicated: Secondary | ICD-10-CM

## 2011-11-01 DIAGNOSIS — E039 Hypothyroidism, unspecified: Secondary | ICD-10-CM

## 2011-11-01 DIAGNOSIS — F411 Generalized anxiety disorder: Secondary | ICD-10-CM

## 2011-11-01 DIAGNOSIS — F419 Anxiety disorder, unspecified: Secondary | ICD-10-CM

## 2011-11-01 DIAGNOSIS — Z23 Encounter for immunization: Secondary | ICD-10-CM

## 2011-11-01 DIAGNOSIS — Z79899 Other long term (current) drug therapy: Secondary | ICD-10-CM

## 2011-11-01 LAB — TSH: TSH: 3.75 u[IU]/mL (ref 0.350–4.500)

## 2011-11-01 NOTE — Patient Instructions (Signed)
Use the serenity prayer the next time you have difficulty with her anxiety.
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
## 2011-12-28 DIAGNOSIS — E782 Mixed hyperlipidemia: Secondary | ICD-10-CM

## 2011-12-28 HISTORY — DX: Mixed hyperlipidemia: E78.2

## 2012-02-16 ENCOUNTER — Ambulatory Visit (INDEPENDENT_AMBULATORY_CARE_PROVIDER_SITE_OTHER): Payer: 59 | Admitting: Medical

## 2012-02-16 ENCOUNTER — Encounter: Payer: Self-pay | Admitting: Medical

## 2012-02-16 VITALS — BP 130/80 | HR 68 | Temp 98.0°F | Resp 16 | Wt 184.0 lb

## 2012-02-16 DIAGNOSIS — F17200 Nicotine dependence, unspecified, uncomplicated: Secondary | ICD-10-CM

## 2012-02-16 DIAGNOSIS — R002 Palpitations: Secondary | ICD-10-CM

## 2012-02-16 DIAGNOSIS — R5383 Other fatigue: Secondary | ICD-10-CM

## 2012-02-16 DIAGNOSIS — R5381 Other malaise: Secondary | ICD-10-CM

## 2012-02-16 DIAGNOSIS — F419 Anxiety disorder, unspecified: Secondary | ICD-10-CM

## 2012-02-16 DIAGNOSIS — F411 Generalized anxiety disorder: Secondary | ICD-10-CM

## 2012-02-16 LAB — COMPREHENSIVE METABOLIC PANEL
ALT: 11 U/L (ref 0–35)
Alkaline Phosphatase: 71 U/L (ref 39–117)
Sodium: 137 mEq/L (ref 135–145)
Total Bilirubin: 0.3 mg/dL (ref 0.3–1.2)
Total Protein: 6.3 g/dL (ref 6.0–8.3)

## 2012-02-16 LAB — CBC WITH DIFFERENTIAL/PLATELET
Basophils Relative: 1 % (ref 0–1)
Eosinophils Absolute: 0.1 10*3/uL (ref 0.0–0.7)
Eosinophils Relative: 2 % (ref 0–5)
MCH: 29.2 pg (ref 26.0–34.0)
MCHC: 32.4 g/dL (ref 30.0–36.0)
MCV: 89.9 fL (ref 78.0–100.0)
Monocytes Relative: 5 % (ref 3–12)
Neutrophils Relative %: 57 % (ref 43–77)
Platelets: 171 10*3/uL (ref 150–400)

## 2012-02-16 LAB — C-REACTIVE PROTEIN: CRP: 0.22 mg/dL (ref <0.60)

## 2012-02-16 MED ORDER — VARENICLINE TARTRATE 0.5 MG PO TABS: 0.5000 mg | ORAL_TABLET | Freq: Two times a day (BID) | ORAL | Status: DC

## 2012-02-16 NOTE — Progress Notes (Signed)
Subjective: Here for complaint of not feeling well.  She feels like her head has been in a fog, has been having frequent palpitations in her chest, feels very tired, and these symptoms have been intermittent for weeks. She has had some headaches, some runny nose, and has been seen by urgent care for similar. She said they mentioned something about an abnormal rhythm on EKG. She denies history of stroke or heart attack. She has had issues with anxiety of recent, has been seen here for similar. A friend of hers passed away last week in addition to the stressors she has been dealing with.  She had discussed this with Dr. Susann Givens here, and he gave her strategies to deal with anxiety.  She has a history of Graves' disease in the remote past, was treated with radiation, and is now hypothyroid on medication. She's not sure if this is her thyroid acting up. She had been exercising in the summer but started back smoking and stopped exercise.  She has been smoking for 27 years on average half a pack a day or more.  He had quit for about a month and Chantix, and like to go back on this.  No other aggravating or relieving factors.    No other c/o.  The following portions of the patient's history were reviewed and updated as appropriate: allergies, current medications, past family history, past medical history, past social history, past surgical history and problem list.  No Known Allergies  Current Outpatient Prescriptions on File Prior to Visit  Medication Sig Dispense Refill  . fluticasone (FLONASE) 50 MCG/ACT nasal spray Place 2 sprays into the nose daily.  16 g  11  . levothyroxine (LEVOXYL) 150 MCG tablet Take 1 tablet (150 mcg total) by mouth daily.  30 tablet  5    Past Medical History  Diagnosis Date  . FH: breast cancer   . Asthma   . Hypothyroidism   . Tobacco use disorder     History reviewed. No pertinent past surgical history.  Family History  Problem Relation Age of Onset  . Hypertension  Mother   . Aneurysm Mother   . Cancer Mother     breast  . Diabetes Father     History   Social History  . Marital Status: Legally Separated    Spouse Name: N/A    Number of Children: N/A  . Years of Education: N/A   Occupational History  . Not on file.   Social History Main Topics  . Smoking status: Current Everyday Smoker -- 0.5 packs/day for 27 years  . Smokeless tobacco: Never Used  . Alcohol Use: No  . Drug Use: No  . Sexually Active: Not on file   Other Topics Concern  . Not on file   Social History Narrative  . No narrative on file    Review of Systems Gen.: No fever, chills, sweats, positive fatigue Skin: No rash HEENT: Some runny nose, no sore throat, ear pain, sinus pressure Heart: Positive palpitations, no chest pain or edema, no PND, no orthopnea GI: No belly pain, nausea, vomiting, diarrhea GU negative: MSK: No myalgias or arthralgias Neuro: Positive mental fog, but otherwise no speech changes, no hearing changes, numbness or tingling, no weakness      Objective:   Physical Exam  General appearance: alert, no distress, WD/WN, black female HEENT: normocephalic, sclerae anicteric, TMs pearly, nares patent, no discharge or erythema, pharynx normal Oral cavity: MMM, no lesions Neck: supple, no lymphadenopathy, no thyromegaly,  no masses, no bruits Heart: RRR, normal S1, S2, no murmurs Lungs: CTA bilaterally, no wheezes, rhonchi, or rales Abdomen: +bs, soft, non tender, non distended, no masses, no hepatomegaly, no splenomegaly Pulses: 2+ symmetric, upper and lower extremities, normal cap refill Ext: Positive clubbing, but no edema or cyanosis   Adult ECG Report  Indication: palpitations  Rate: 62 bpm  Rhythm: normal sinus rhythm  QRS Axis: 10 degrees  PR Interval: 146 ms  QRS Duration: 84 ms  QTc: 397 ms   Conduction Disturbances: none  Other Abnormalities: T wave inversion V1 and III  Patient's cardiac risk factors are: sedentary lifestyle  and smoking/ tobacco exposure.  EKG comparison: none  Narrative Interpretation: nonspecific T wave inversion, otherwise normal EKG    Assessment and Plan :    . Encounter Diagnoses  Name Primary?  . Palpitations Yes  . Fatigue   . Tobacco use disorder   . Anxiety    Labs today, EKG with T wave inversions but nonspecific.  Etiology unclear.  Anxiety seems to be an issue, advised counseling, discussed ways to deal with anxiety, and will put her back on Chantix to help stop smoking.  Pending labs, consider beta blocker for anxiety and palpitations, consider referral to cardiology for palpitations and given her tobacco use history, symptoms, and concerns.

## 2012-02-16 NOTE — Patient Instructions (Signed)
YOU CAN QUIT SMOKING!  Talk to your medical provider about using medicines to help you quit. These include nicotine replacement gum, lozenges, or skin patches.  Consider calling 1-800-QUIT-NOW, a toll free 24/7 hotline with free counseling to help you quit.  If you are ready to quit smoking or are thinking about it, congratulations! You have chosen to help yourself be healthier and live longer! There are lots of different ways to quit smoking. Nicotine gum, nicotine patches, a nicotine inhaler, or nicotine nasal spray can help with physical craving. Hypnosis, support groups, and medicines help break the habit of smoking. TIPS TO GET OFF AND STAY OFF CIGARETTES  Learn to predict your moods. Do not let a bad situation be your excuse to have a cigarette. Some situations in your life might tempt you to have a cigarette.   Ask friends and co-workers not to smoke around you.   Make your home smoke-free.   Never have "just one" cigarette. It leads to wanting another and another. Remind yourself of your decision to quit.   On a card, make a list of your reasons for not smoking. Read it at least the same number of times a day as you have a cigarette. Tell yourself everyday, "I do not want to smoke. I choose not to smoke."   Ask someone at home or work to help you with your plan to quit smoking.   Have something planned after you eat or have a cup of coffee. Take a walk or get other exercise to perk you up. This will help to keep you from overeating.   Try a relaxation exercise to calm you down and decrease your stress. Remember, you may be tense and nervous the first two weeks after you quit. This will pass.   Find new activities to keep your hands busy. Play with a pen, coin, or rubber band. Doodle or draw things on paper.   Brush your teeth right after eating. This will help cut down the craving for the taste of tobacco after meals. You can try mouthwash too.   Try gum, breath mints, or diet  candy to keep something in your mouth.  IF YOU SMOKE AND WANT TO QUIT:  Do not stock up on cigarettes. Never buy a carton. Wait until one pack is finished before you buy another.   Never carry cigarettes with you at work or at home.   Keep cigarettes as far away from you as possible. Leave them with someone else.   Never carry matches or a lighter with you.   Ask yourself, "Do I need this cigarette or is this just a reflex?"   Bet with someone that you can quit. Put cigarette money in a piggy bank every morning. If you smoke, you give up the money. If you do not smoke, by the end of the week, you keep the money.   Keep trying. It takes 21 days to change a habit!  Document Released: 10/09/2009 Document Revised: 08/25/2011 Document Reviewed: 10/09/2009 [employer] Patient Information 2012 [employer].   

## 2012-02-18 ENCOUNTER — Other Ambulatory Visit: Payer: Self-pay | Admitting: Medical

## 2012-02-18 MED ORDER — METOPROLOL TARTRATE 25 MG PO TABS: ORAL_TABLET | ORAL | Status: DC

## 2012-02-25 DIAGNOSIS — Z01419 Encounter for gynecological examination (general) (routine) without abnormal findings: Secondary | ICD-10-CM

## 2012-02-25 HISTORY — DX: Encounter for gynecological examination (general) (routine) without abnormal findings: Z01.419

## 2012-03-01 ENCOUNTER — Encounter: Payer: Self-pay | Admitting: Internal Medicine

## 2012-03-14 ENCOUNTER — Ambulatory Visit (INDEPENDENT_AMBULATORY_CARE_PROVIDER_SITE_OTHER): Payer: 59 | Admitting: Medical

## 2012-03-14 ENCOUNTER — Other Ambulatory Visit (HOSPITAL_COMMUNITY)
Admission: RE | Admit: 2012-03-14 | Discharge: 2012-03-14 | Disposition: A | Discharge status: Home / Self Care | Payer: 59 | Source: Ambulatory Visit | Attending: Medical | Admitting: Medical

## 2012-03-14 ENCOUNTER — Encounter: Payer: Self-pay | Admitting: Medical

## 2012-03-14 VITALS — BP 110/80 | HR 68 | Temp 98.1°F | Resp 14 | Wt 189.0 lb

## 2012-03-14 DIAGNOSIS — F17200 Nicotine dependence, unspecified, uncomplicated: Secondary | ICD-10-CM

## 2012-03-14 DIAGNOSIS — Z0000 Encounter for general adult medical examination without abnormal findings: Secondary | ICD-10-CM

## 2012-03-14 DIAGNOSIS — Z124 Encounter for screening for malignant neoplasm of cervix: Secondary | ICD-10-CM

## 2012-03-14 DIAGNOSIS — Z309 Encounter for contraceptive management, unspecified: Secondary | ICD-10-CM

## 2012-03-14 DIAGNOSIS — Z113 Encounter for screening for infections with a predominantly sexual mode of transmission: Secondary | ICD-10-CM | POA: Insufficient documentation

## 2012-03-14 DIAGNOSIS — Z1159 Encounter for screening for other viral diseases: Secondary | ICD-10-CM | POA: Insufficient documentation

## 2012-03-14 DIAGNOSIS — R002 Palpitations: Secondary | ICD-10-CM

## 2012-03-14 DIAGNOSIS — Z01419 Encounter for gynecological examination (general) (routine) without abnormal findings: Secondary | ICD-10-CM | POA: Insufficient documentation

## 2012-03-14 LAB — POCT URINALYSIS DIPSTICK
Bilirubin, UA: NEGATIVE
Nitrite, UA: NEGATIVE
Urobilinogen, UA: NEGATIVE
pH, UA: 5

## 2012-03-14 LAB — POCT URINE PREGNANCY: Preg Test, Ur: NEGATIVE

## 2012-03-14 MED ORDER — BUPROPION HCL ER (XL) 150 MG PO TB24: 150.0000 mg | ORAL_TABLET | Freq: Every day | ORAL | Status: DC

## 2012-03-14 MED ORDER — FLUTICASONE PROPIONATE 50 MCG/ACT NA SUSP: 2.0000 | Freq: Every day | NASAL | Status: DC
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
## 2012-03-15 ENCOUNTER — Other Ambulatory Visit: Payer: Self-pay | Admitting: Medical

## 2012-03-15 LAB — LIPID PANEL
HDL: 44 mg/dL (ref >39)
LDL Cholesterol: 188 mg/dL — ABNORMAL HIGH (ref 0–99)
VLDL: 45 mg/dL — ABNORMAL HIGH (ref 0–40)

## 2012-03-15 MED ORDER — ATORVASTATIN CALCIUM 20 MG PO TABS: 20.0000 mg | ORAL_TABLET | Freq: Every day | ORAL | Status: DC

## 2012-03-20 ENCOUNTER — Telehealth: Payer: Self-pay | Admitting: Family Medicine

## 2012-03-20 NOTE — Telephone Encounter (Signed)
Pap and STD screening was normal.    If I understand the message, did she begin both Chantix and Lipitor last week?    If so, the weird dreams was from the Chantix.  Lets have her begin the Lipitor back, hold off on the Chantix for now, and lets see if she tolerates the Lipitor without problems.  Have her recheck 6wk on cholesterol.

## 2012-03-20 NOTE — Telephone Encounter (Signed)
Patient called and said that the Lipitor and the Chantix makes her mind go crazy, thinking crazy thoughts and she can't sleep. She states that she is concerned about her cholesterol. She said she stop taking both of the medication. Patient also wanted to know her pap results. CLS

## 2012-03-21 NOTE — Telephone Encounter (Signed)
Patient was notified of her pap results. She was also notified to start back on Lipitor and to see how she tolerates the medication over the next few weeks and to follow up in 6 weeks. CLS

## 2012-03-27 DIAGNOSIS — R002 Palpitations: Secondary | ICD-10-CM

## 2012-03-27 HISTORY — DX: Palpitations: R00.2

## 2012-04-03 ENCOUNTER — Emergency Department (HOSPITAL_COMMUNITY)
Admission: EM | Admit: 2012-04-03 | Discharge: 2012-04-03 | Disposition: A | Discharge status: Home / Self Care | Payer: 59 | Attending: Emergency Medicine | Admitting: Emergency Medicine

## 2012-04-03 ENCOUNTER — Other Ambulatory Visit: Payer: Self-pay

## 2012-04-03 ENCOUNTER — Encounter (HOSPITAL_COMMUNITY): Payer: Self-pay | Admitting: *Deleted

## 2012-04-03 DIAGNOSIS — J45909 Unspecified asthma, uncomplicated: Secondary | ICD-10-CM | POA: Insufficient documentation

## 2012-04-03 DIAGNOSIS — Z79899 Other long term (current) drug therapy: Secondary | ICD-10-CM | POA: Insufficient documentation

## 2012-04-03 DIAGNOSIS — H81399 Other peripheral vertigo, unspecified ear: Secondary | ICD-10-CM

## 2012-04-03 DIAGNOSIS — H6120 Impacted cerumen, unspecified ear: Secondary | ICD-10-CM | POA: Insufficient documentation

## 2012-04-03 DIAGNOSIS — E039 Hypothyroidism, unspecified: Secondary | ICD-10-CM | POA: Insufficient documentation

## 2012-04-03 DIAGNOSIS — I10 Essential (primary) hypertension: Secondary | ICD-10-CM | POA: Insufficient documentation

## 2012-04-03 HISTORY — DX: Essential (primary) hypertension: I10

## 2012-04-03 LAB — POCT I-STAT, CHEM 8
BUN: 6 mg/dL (ref 6–23)
Calcium, Ion: 1.27 mmol/L (ref 1.12–1.32)
Chloride: 107 mEq/L (ref 96–112)
Creatinine, Ser: 1.1 mg/dL (ref 0.50–1.10)
Glucose, Bld: 99 mg/dL (ref 70–99)
TCO2: 23 mmol/L (ref 0–100)

## 2012-04-03 LAB — URINALYSIS, ROUTINE W REFLEX MICROSCOPIC
Bilirubin Urine: NEGATIVE
Hgb urine dipstick: NEGATIVE
Ketones, ur: NEGATIVE mg/dL
Protein, ur: NEGATIVE mg/dL
Urobilinogen, UA: 0.2 mg/dL (ref 0.0–1.0)

## 2012-04-03 MED ORDER — MECLIZINE HCL 25 MG PO TABS
25.0000 mg | ORAL_TABLET | Freq: Once | ORAL | Status: AC
  Administered 2012-04-03: 25 mg via ORAL
  Filled 2012-04-03: qty 1

## 2012-04-03 MED ORDER — MECLIZINE HCL 50 MG PO TABS: 25.0000 mg | ORAL_TABLET | Freq: Three times a day (TID) | ORAL | Status: AC | PRN

## 2012-04-03 NOTE — ED Notes (Addendum)
Pt states she has a heart monitor on x 2 weeks, reports she was having palpitations. Denies any today.

## 2012-04-03 NOTE — ED Notes (Signed)
Pt states she woke up this am feeling dizzy. States it seems like everything is going to the left. States very light headed. Stroke scale negative.

## 2012-04-03 NOTE — ED Notes (Signed)
Patient ambulated around room without any difficulty, states "I feel like I'm going to the left.  Symptoms improve while lying down. Reports no pain or heart palpitations.
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
## 2012-04-03 NOTE — Discharge Instructions (Signed)
Dizziness Dizziness is a common problem. It is a feeling of unsteadiness or lightheadedness. You may feel like you are about to faint. Dizziness can lead to injury if you stumble or fall. A person of any age group can suffer from dizziness, but dizziness is more common in older adults. CAUSES  Dizziness can be caused by many different things, including:  Middle ear problems.   Standing for too long.   Infections.   An allergic reaction.   Aging.   An emotional response to something, such as the sight of blood.   Side effects of medicines.   Fatigue.   Problems with circulation or blood pressure.   Excess use of alcohol, medicines, or illegal drug use.   Breathing too fast (hyperventilation).   An arrhythmia or problems with your heart rhythm.   Low red blood cell count (anemia).   Pregnancy.   Vomiting, diarrhea, fever, or other illnesses that cause dehydration.   Diseases or conditions such as Parkinson's disease, high blood pressure (hypertension), diabetes, and thyroid problems.   Exposure to extreme heat.  DIAGNOSIS  To find the cause of your dizziness, your caregiver may do a physical exam, lab tests, radiologic imaging scans, or an electrocardiography test (ECG).  TREATMENT  Treatment of dizziness depends on the cause of your symptoms and can vary greatly. HOME CARE INSTRUCTIONS   Drink enough fluids to keep your urine clear or pale yellow. This is especially important in very hot weather. In the elderly, it is also important in cold weather.   If your dizziness is caused by medicines, take them exactly as directed. When taking blood pressure medicines, it is especially important to get up slowly.   Rise slowly from chairs and steady yourself until you feel okay.   In the morning, first sit up on the side of the bed. When this seems okay, stand slowly while holding onto something until you know your balance is fine.   If you need to stand in one place for a  long time, be sure to move your legs often. Tighten and relax the muscles in your legs while standing.   If dizziness continues to be a problem, have someone stay with you for a day or two. Do this until you feel you are well enough to stay alone. Have the person call your caregiver if he or she notices changes in you that are concerning.   Do not drive or use heavy machinery if you feel dizzy.  SEEK IMMEDIATE MEDICAL CARE IF:   Your dizziness or lightheadedness gets worse.   You feel nauseous or vomit.   You develop problems with talking, walking, weakness, or using your arms, hands, or legs.   You are not thinking clearly or you have difficulty forming sentences. It may take a friend or family member to determine if your thinking is normal.   You develop chest pain, abdominal pain, shortness of breath, or sweating.   Your vision changes.   You notice any bleeding.   You have side effects from medicine that seems to be getting worse rather than better.  MAKE SURE YOU:   Understand these instructions.   Will watch your condition.   Will get help right away if you are not doing well or get worse.  Document Released: 06/08/2001 Document Revised: 12/02/2011 Document Reviewed: 07/02/2011 [facility] Patient Information 2012 [place], [place].  Cerumen Impaction A cerumen impaction is when the wax in your ear forms a plug. This plug usually causes  reduced hearing. Sometimes it also causes an earache or dizziness. Removing a cerumen impaction can be difficult and painful. The wax sticks to the ear canal. The canal is sensitive and bleeds easily. If you try to remove a heavy wax buildup with a cotton tipped swab, you may push it in further. Irrigation with water, suction, and small ear curettes may be used to clear out the wax. If the impaction is fixed to the skin in the ear canal, ear drops may be needed for a few days to loosen the wax. People who build up a lot of wax frequently can use ear  wax removal products available in your local drugstore. SEEK MEDICAL CARE IF:  You develop an earache, increased hearing loss, or marked dizziness. Document Released: 01/20/2005 Document Revised: 12/02/2011 Document Reviewed: 03/12/2010 [facility] Patient Information 2012 [place], [place].

## 2012-04-06 NOTE — ED Provider Notes (Signed)
Medical screening examination/treatment/procedure(s) were performed by non-physician practitioner and as supervising physician I was immediately available for consultation/collaboration.  Cyndra Numbers, MD 04/06/12 1314

## 2012-07-10 ENCOUNTER — Telehealth: Payer: Self-pay | Admitting: Internal Medicine

## 2012-07-10 MED ORDER — LEVOTHYROXINE SODIUM 150 MCG PO TABS: 150.0000 ug | ORAL_TABLET | Freq: Every day | ORAL | Status: DC

## 2012-07-10 NOTE — Telephone Encounter (Signed)
done

## 2012-08-01 ENCOUNTER — Encounter: Payer: Self-pay | Admitting: Medical

## 2012-08-01 ENCOUNTER — Ambulatory Visit (INDEPENDENT_AMBULATORY_CARE_PROVIDER_SITE_OTHER): Payer: 59 | Admitting: Medical

## 2012-08-01 ENCOUNTER — Encounter: Payer: 59 | Admitting: Medical

## 2012-08-01 VITALS — BP 102/80 | HR 76 | Resp 16 | Wt 180.0 lb

## 2012-08-01 DIAGNOSIS — E785 Hyperlipidemia, unspecified: Secondary | ICD-10-CM

## 2012-08-01 DIAGNOSIS — Z309 Encounter for contraceptive management, unspecified: Secondary | ICD-10-CM

## 2012-08-01 DIAGNOSIS — E039 Hypothyroidism, unspecified: Secondary | ICD-10-CM

## 2012-08-01 DIAGNOSIS — T50905A Adverse effect of unspecified drugs, medicaments and biological substances, initial encounter: Secondary | ICD-10-CM

## 2012-08-01 DIAGNOSIS — T887XXA Unspecified adverse effect of drug or medicament, initial encounter: Secondary | ICD-10-CM

## 2012-08-01 DIAGNOSIS — E663 Overweight: Secondary | ICD-10-CM

## 2012-08-01 DIAGNOSIS — F17200 Nicotine dependence, unspecified, uncomplicated: Secondary | ICD-10-CM

## 2012-08-01 LAB — LIPID PANEL
HDL: 37 mg/dL — ABNORMAL LOW (ref >39)
LDL Cholesterol: 174 mg/dL — ABNORMAL HIGH (ref 0–99)
Total CHOL/HDL Ratio: 6.5 Ratio
Triglycerides: 141 mg/dL (ref <150)

## 2012-08-01 NOTE — Patient Instructions (Signed)
YOU CAN QUIT SMOKING!  Talk to your medical provider about using medicines to help you quit. These include nicotine replacement gum, lozenges, or skin patches.  Consider calling 1-800-QUIT-NOW, a toll free 24/7 hotline with free counseling to help you quit.  If you are ready to quit smoking or are thinking about it, congratulations! You have chosen to help yourself be healthier and live longer! There are lots of different ways to quit smoking. Nicotine gum, nicotine patches, a nicotine inhaler, or nicotine nasal spray can help with physical craving. Hypnosis, support groups, and medicines help break the habit of smoking. TIPS TO GET OFF AND STAY OFF CIGARETTES  Learn to predict your moods. Do not let a bad situation be your excuse to have a cigarette. Some situations in your life might tempt you to have a cigarette.   Ask friends and co-workers not to smoke around you.   Make your home smoke-free.   Never have "just one" cigarette. It leads to wanting another and another. Remind yourself of your decision to quit.   On a card, make a list of your reasons for not smoking. Read it at least the same number of times a day as you have a cigarette. Tell yourself everyday, "I do not want to smoke. I choose not to smoke."   Ask someone at home or work to help you with your plan to quit smoking.   Have something planned after you eat or have a cup of coffee. Take a walk or get other exercise to perk you up. This will help to keep you from overeating.   Try a relaxation exercise to calm you down and decrease your stress. Remember, you may be tense and nervous the first two weeks after you quit. This will pass.   Find new activities to keep your hands busy. Play with a pen, coin, or rubber band. Doodle or draw things on paper.   Brush your teeth right after eating. This will help cut down the craving for the taste of tobacco after meals. You can try mouthwash too.   Try gum, breath mints, or diet  candy to keep something in your mouth.  IF YOU SMOKE AND WANT TO QUIT:  Do not stock up on cigarettes. Never buy a carton. Wait until one pack is finished before you buy another.   Never carry cigarettes with you at work or at home.   Keep cigarettes as far away from you as possible. Leave them with someone else.   Never carry matches or a lighter with you.   Ask yourself, "Do I need this cigarette or is this just a reflex?"   Bet with someone that you can quit. Put cigarette money in a piggy bank every morning. If you smoke, you give up the money. If you do not smoke, by the end of the week, you keep the money.   Keep trying. It takes 21 days to change a habit!  Document Released: 10/09/2009 Document Revised: 08/25/2011 Document Reviewed: 10/09/2009 [employer] Patient Information 2012 [employer].   

## 2012-08-02 ENCOUNTER — Other Ambulatory Visit: Payer: Self-pay | Admitting: Medical

## 2012-08-02 ENCOUNTER — Encounter: Payer: Self-pay | Admitting: Medical

## 2012-08-02 DIAGNOSIS — E785 Hyperlipidemia, unspecified: Secondary | ICD-10-CM | POA: Insufficient documentation

## 2012-08-02 DIAGNOSIS — E039 Hypothyroidism, unspecified: Secondary | ICD-10-CM | POA: Insufficient documentation

## 2012-08-02 MED ORDER — LEVOTHYROXINE SODIUM 150 MCG PO TABS: 150.0000 ug | ORAL_TABLET | Freq: Every day | ORAL | Status: DC

## 2012-08-02 MED ORDER — ATORVASTATIN CALCIUM 20 MG PO TABS: 20.0000 mg | ORAL_TABLET | Freq: Every day | ORAL | Status: DC

## 2012-08-02 NOTE — Progress Notes (Addendum)
Subjective: Here for recheck.   I saw her back in March for physical and several concerns.  She is here for recheck on cholesterol.  I had recommended she begin Lipitor back in March.  She ended up taking this and Chantix, starting both the same time, but quickly started feeling weird having dreams.  Ended up stopping both Lipitor and Chantix.  She wasn't which caused the weird feeling.  She has made some diet and exercise changes since then, wants to recheck the lipids.  She ended up seeing cardiology for palpitations. Things checked out fine.   She has had no more palpitations, ended up stopping the metoprolol.  She decided not to use any form of contraception for now, no OCPs, depo shot, etc.  She is smoking about 1/2 ppd.  She wants the skin lesions that we saw during her physical along the vulva and anus removed, but not today.  She feels like her thyroid medication is not working.  Recently had to change to synthroid since the Levoxyl was unavailable at the pharmacy.   Wants to recheck those labs today.  No other c/o.  The following portions of the patient's history were reviewed and updated as appropriate: allergies, current medications, past family history, past medical history, past social history, past surgical history and problem list.  Past Medical History  Diagnosis Date  . FH: breast cancer     mother and grandmother  . Asthma   . Hypothyroidism   . Tobacco use disorder   . Hypertension     No Known Allergies   Review of Systems ROS reviewed and was negative other than noted in HPI or above.    Objective:   Physical Exam  General appearance: alert, no distress, WD/WN Oral cavity: MMM, no lesions Neck: supple, no lymphadenopathy, no thyromegaly, no masses Heart: RRR, normal S1, S2, no murmurs Lungs: CTA bilaterally, no wheezes, rhonchi, or rales  Assessment and Plan :     Encounter Diagnoses  Name Primary?  . Hypothyroidism Yes  . Hyperlipidemia   . Tobacco use  disorder   . Overweight   . Adverse reaction to drug   . Contraception management    Hypothyroidism - labs today.  Consider name brand Synthroid.  Hyperlipidemia - repeat labs, likely restart Lipitor.  Tobacco use disorder - recommended 1-800-QUIT-NOW hotline, encouraged her to stop tobacco.   Overweight - advised she use My Fitness Pal app to track her food intake and exercise, work on diet and exercise changes.  Advised reaction - most likely to Chantix.  She has also not done well with Wellbutrin in the past.    Contraception management- declines medication  F/u pending labs.

## 2012-10-13 ENCOUNTER — Telehealth: Payer: Self-pay | Admitting: Family Medicine

## 2012-10-13 NOTE — Telephone Encounter (Signed)
OV regarding sleep.  Can use benadryl OTC QHS if needed in the meantime.

## 2012-10-13 NOTE — Telephone Encounter (Signed)
LMOM FOR THE PATIENT THAT SHE WILL NEED A OFFICE VISIT. CLS

## 2012-10-23 ENCOUNTER — Ambulatory Visit (INDEPENDENT_AMBULATORY_CARE_PROVIDER_SITE_OTHER): Payer: 59 | Admitting: Medical

## 2012-10-23 ENCOUNTER — Encounter: Payer: Self-pay | Admitting: Medical

## 2012-10-23 VITALS — BP 120/80 | HR 80 | Temp 98.1°F | Resp 16 | Wt 182.0 lb

## 2012-10-23 DIAGNOSIS — Z23 Encounter for immunization: Secondary | ICD-10-CM

## 2012-10-23 DIAGNOSIS — G4700 Insomnia, unspecified: Secondary | ICD-10-CM

## 2012-10-23 DIAGNOSIS — F17200 Nicotine dependence, unspecified, uncomplicated: Secondary | ICD-10-CM

## 2012-10-23 DIAGNOSIS — J45909 Unspecified asthma, uncomplicated: Secondary | ICD-10-CM

## 2012-10-23 DIAGNOSIS — M5380 Other specified dorsopathies, site unspecified: Secondary | ICD-10-CM

## 2012-10-23 DIAGNOSIS — M62830 Muscle spasm of back: Secondary | ICD-10-CM

## 2012-10-23 MED ORDER — CYCLOBENZAPRINE HCL 10 MG PO TABS: ORAL_TABLET | ORAL | Status: DC

## 2012-10-23 MED ORDER — MELATONIN 1 MG PO TABS: 1.0000 | ORAL_TABLET | Freq: Every day | ORAL | Status: DC

## 2012-10-23 NOTE — Patient Instructions (Signed)

## 2012-10-23 NOTE — Addendum Note (Signed)
Addended by: Leretha Dykes L on: 10/23/2012 11:11 AM   Modules accepted: Orders

## 2012-10-23 NOTE — Progress Notes (Signed)
Subjective: Having issues with sleep.   Seems to be averaging 6 or less per night.   Bedtime is 10 pm.  Wake time ideally is 7:30am.  Wakes up sometimes 3am, 5:30am, can't stay asleep.  She will have to watch tv or play computer game sometimes to fall asleep.   This has been a problem for several weeks.  Tried benadryl but this didn't help.  Has never been on sleep aids in the past.   Works Clinical biochemist.   Exercise with walking, during breaks at work.  Lives at home with daughter and boyfriend.  Snores, but no witnessed apnea.  Sometimes drinks wine in the evening.  Overall less stressed than prior.   Did a 5K on 09/23/12, was in a lot of back pain then.   For a few weeks couldn't get to sleep due to pain.  Lately just can't seem to stay asleep.  No prior medication use for insomnia.  Past Medical History  Diagnosis Date  . FH: breast cancer     mother and grandmother  . Asthma   . Hypothyroidism   . Tobacco use disorder   . Palpitations 03/2012    cardiac consult, echocardiogram, holter monitor - Dr. Donnie Aho   ROS as noted in HPI    Objective:   Physical Exam  Filed Vitals:   10/23/12 0821  BP: 120/80  Pulse: 80  Temp: 98.1 F (36.7 C)  Resp: 16    General appearance: alert, no distress, WD/WN  Neck: supple, no lymphadenopathy, no thyromegaly, no masses Heart: RRR, normal S1, S2, no murmurs Lungs: CTA bilaterally, no wheezes, rhonchi, or rales Abdomen: +bs, soft, non tender, non distended, no masses, no hepatomegaly, no splenomegaly Back: mild lumbar paraspinal tenderness, ROM relatively normal Pulses: 2+ symmetric   Assessment and Plan :    Encounter Diagnoses  Name Primary?  . Insomnia Yes  . Muscle spasm of back   . Need for influenza vaccination   . Need for pneumococcal vaccination   . Tobacco use disorder   . Asthma    Insomnia- can use Flexeril 1/2-1 tablet QHS occasionally for both back spasm and insomnia.  discussed sleep hygiene, strategies to deal with  sleep.    Flu vaccine, VIS and counseling given.  Pneumococcal vaccine, VIS, and counseling given due to smoker and hx/o asthma.    Tobacco use disorder - not ready to quit.  I advised of risks and recommend she stop tobacco.  Asthma- no recent use of inhaler.  Stable, but advised to stop tobacco.  Follow-up in 1-2 mo.

## 2012-11-27 ENCOUNTER — Other Ambulatory Visit: Payer: Self-pay | Admitting: Medical

## 2012-11-27 MED ORDER — LEVOTHYROXINE SODIUM 150 MCG PO TABS: 150.0000 ug | ORAL_TABLET | Freq: Every day | ORAL | Status: DC

## 2012-11-27 NOTE — Telephone Encounter (Signed)
RX was sent to the pharmacy. CLS 

## 2012-11-27 NOTE — Telephone Encounter (Signed)
Needs refill on synthroid 150 mcg #90  Patient states Rx has to say "branded generic"  Please call patient, ok to leave message    [facility]

## 2012-11-30 ENCOUNTER — Other Ambulatory Visit: Payer: Self-pay | Admitting: Family Medicine

## 2012-11-30 MED ORDER — LEVOTHYROXINE SODIUM 150 MCG PO TABS: 150.0000 ug | ORAL_TABLET | Freq: Every day | ORAL | Status: DC

## 2012-11-30 NOTE — Telephone Encounter (Signed)
Patient called in requesting that [facility] [address] [facility] for a 90 day supply. CLS

## 2012-12-04 ENCOUNTER — Telehealth: Payer: Self-pay | Admitting: Medical

## 2012-12-04 NOTE — Telephone Encounter (Signed)
I FAX THE FORM BACK TO THE PHARMACY ABOUT THE GENERIC RX.[facility]

## 2013-01-12 ENCOUNTER — Encounter: Payer: Self-pay | Admitting: Medical

## 2013-01-12 ENCOUNTER — Ambulatory Visit (INDEPENDENT_AMBULATORY_CARE_PROVIDER_SITE_OTHER): Payer: 59 | Admitting: Medical

## 2013-01-12 VITALS — BP 120/82 | HR 72 | Temp 98.5°F | Resp 16 | Wt 181.0 lb

## 2013-01-12 DIAGNOSIS — M461 Sacroiliitis, not elsewhere classified: Secondary | ICD-10-CM

## 2013-01-12 DIAGNOSIS — Z3009 Encounter for other general counseling and advice on contraception: Secondary | ICD-10-CM

## 2013-01-12 DIAGNOSIS — M76899 Other specified enthesopathies of unspecified lower limb, excluding foot: Secondary | ICD-10-CM

## 2013-01-12 DIAGNOSIS — M7062 Trochanteric bursitis, left hip: Secondary | ICD-10-CM

## 2013-01-12 MED ORDER — DICLOFENAC SODIUM 75 MG PO TBEC: 75.0000 mg | DELAYED_RELEASE_TABLET | Freq: Two times a day (BID) | ORAL | Status: DC

## 2013-01-12 NOTE — Progress Notes (Signed)
Subjective: Here for c/o left low back and hip pain.  Has been hurting, but in late December really started hurting bad.  When standing and walking, feels like hip bone is rubbing and grinding.  Denies trauma, fall, injury.  Not worse any particular time of day, but worse with walking and activity and ambulating, not when seated or lying.  Denies fever, no incontinence, no blood in urine or stool.  No numbness or tinging in left leg.   No weakness in legs.   No swelling.  Using Goody Powders at times or toughs it out.   Has questions about contraception.  Doesn't want to get pregnant but has questions and concerns.  Periods are regular and not heavy.  No abdominal pain or bleeding issues.   Past Medical History  Diagnosis Date  . FH: breast cancer     mother and grandmother  . Asthma   . Hypothyroidism   . Tobacco use disorder   . Palpitations 03/2012    cardiac consult, echocardiogram, holter monitor - Dr. Donnie Aho   ROS as in subjective  Objective: Gen: wd, wn, nad, seated on table Back/pelvis: tender along left SI joint, but normal back ROM MSK: tender left greater trochanteric bursa, but otherwise legs nontender, normal ROM, no swelling, no deformity bilat neurovascularly intact LE,-SLR, normal DTRs, strength, sensation normal  Assessment: Encounter Diagnoses  Name Primary?  . Sacroiliitis Yes  . Trochanteric bursitis of left hip   . General counselling and advice on contraception    Plan: Script for diclofenac, relative rest, recheck if not improving  General counseling on risks/benefits of contraception. She will check into costs of tubal ligation with gynecology vs IUD or oral but discussed risks of hormonal contraception particular being a smoker over age 68yo.  Last pap 02/2012 normal.  F/u with Dr. Susann Givens for skin lesion excision

## 2013-01-12 NOTE — Patient Instructions (Signed)
Sacroiliac Joint Dysfunction The sacroiliac joint connects the lower part of the spine (the sacrum) with the bones of the pelvis. CAUSES  Sometimes, there is no obvious reason for sacroiliac joint dysfunction. Other times, it may occur   During pregnancy.  After injury, such as:  Car accidents.  Sport-related injuries.  Work-related injuries.  Due to one leg being shorter than the other.  Due to other conditions that affect the joints, such as:  Rheumatoid arthritis.  Gout.  Psoriasis.  Joint infection (septic arthritis). SYMPTOMS  Symptoms may include:  Pain in the:  Lower back.  Buttocks.  Groin.  Thighs and legs.  Difficult sitting, standing, walking, lying, bending or lifting. DIAGNOSIS  A number of tests may be used to help diagnose the cause of sacroiliac joint dysfunction, including:  Imaging tests to look for other causes of pain, including:  MRI.  CT scan.  Bone scan.  Diagnostic injection: During a special x-ray (called fluoroscopy), a needle is put into the sacroiliac joint. A numbing medicine is injected into the joint. If the pain is improved or stopped, the diagnosis of sacroiliac joint dysfunction is more likely. TREATMENT  There are a number of types of treatment used for sacroiliac joint dysfunction, including:  Only take over-the-counter or prescription medicines for pain, discomfort, or fever as directed by your caregiver.  Medications to relax muscles.  Rest. Decreasing activity can help cut down on painful muscle spasms and allow the back to heal.  Application of heat or ice to the lower back may improve muscle spasms and soothe pain.  Brace. A special back brace, called a sacroiliac belt, can help support the joint while your back is healing.  Physical therapy can help teach comfortable positions and exercises to strengthen muscles that support the sacroiliac joint.  Cortisone injections. Injections of steroid medicine into the  joint can help decrease swelling and improve pain.  Hyaluronic acid injections. This chemical improves lubrication within the sacroiliac joint, thereby decreasing pain.  Radiofrequency ablation. A special needle is placed into the joint, where it burns away nerves that are carrying pain messages from the joint.  Surgery. Because pain occurs during movement of the joint, screws and plates may be installed in order to limit or prevent joint motion. HOME CARE INSTRUCTIONS   Take all medications exactly as directed.  Follow instructions regarding both rest and physical activity, to avoid worsening the pain.  Do physical therapy exercises exactly as prescribed. SEEK IMMEDIATE MEDICAL CARE IF:  You experience increasingly severe pain.  You develop new symptoms, such as numbness or tingling in your legs or feet.  You lose bladder or bowel control. Document Released: 03/11/2009 Document Revised: 03/06/2012 Document Reviewed: 03/11/2009 Exit[employer] Patient Information 2013 [employer].  

## 2013-01-26 ENCOUNTER — Encounter: Payer: Self-pay | Admitting: Internal Medicine

## 2013-02-13 ENCOUNTER — Ambulatory Visit: Payer: 59 | Admitting: Family Medicine

## 2013-03-27 LAB — HM MAMMOGRAPHY: HM Mammogram: NEGATIVE

## 2013-07-09 ENCOUNTER — Telehealth: Payer: Self-pay | Admitting: Internal Medicine

## 2013-07-09 MED ORDER — ATORVASTATIN CALCIUM 20 MG PO TABS: 20.0000 mg | ORAL_TABLET | Freq: Every day | ORAL | Status: DC

## 2013-07-09 NOTE — Telephone Encounter (Signed)
Pt needs a refill on [facility]itor #[facility] to [facility]

## 2013-07-09 NOTE — Telephone Encounter (Signed)
I sent refills to the patients pharmacy. CLS

## 2013-07-25 ENCOUNTER — Other Ambulatory Visit: Payer: Self-pay | Admitting: Medical

## 2013-07-25 ENCOUNTER — Telehealth: Payer: Self-pay | Admitting: Medical

## 2013-07-25 MED ORDER — VARENICLINE TARTRATE 0.5 MG X 11 & 1 MG X 42 PO MISC: ORAL | Status: DC

## 2013-07-25 NOTE — Progress Notes (Signed)
Left message on pt vm that med was called out and to call [phone] quit now and follow-up here in 1 month

## 2013-07-27 NOTE — Telephone Encounter (Signed)
Done

## 2013-09-01 ENCOUNTER — Other Ambulatory Visit: Payer: Self-pay | Admitting: Medical

## 2013-11-01 ENCOUNTER — Other Ambulatory Visit: Payer: Self-pay

## 2013-12-14 ENCOUNTER — Other Ambulatory Visit: Payer: Self-pay | Admitting: Medical

## 2013-12-31 ENCOUNTER — Telehealth: Payer: Self-pay | Admitting: Medical

## 2013-12-31 ENCOUNTER — Other Ambulatory Visit: Payer: Self-pay | Admitting: Family Medicine

## 2013-12-31 MED ORDER — FLUTICASONE PROPIONATE 50 MCG/ACT NA SUSP: 1.0000 | Freq: Every day | NASAL | Status: DC

## 2013-12-31 MED ORDER — LEVOTHYROXINE SODIUM 150 MCG PO TABS: 150.0000 ug | ORAL_TABLET | Freq: Every day | ORAL | Status: DC

## 2013-12-31 MED ORDER — ATORVASTATIN CALCIUM 20 MG PO TABS: 20.0000 mg | ORAL_TABLET | Freq: Every day | ORAL | Status: DC

## 2013-12-31 NOTE — Telephone Encounter (Signed)
Rx refills are sent . CLS

## 2013-12-31 NOTE — Telephone Encounter (Signed)
Refill whatever she is out of, then f/u within 53mo (2-3 wk out) if due for appt

## 2013-12-31 NOTE — Telephone Encounter (Signed)
Needs refill on thyroid meds, has been off for a couple of weeks. She did receive message that she needs appointment. Do you want to see her soon for appointment and labs or do you want to refill meds and then have her come in after being back on meds  Please call

## 2014-01-22 ENCOUNTER — Emergency Department (INDEPENDENT_AMBULATORY_CARE_PROVIDER_SITE_OTHER): Payer: 59

## 2014-01-22 ENCOUNTER — Emergency Department (INDEPENDENT_AMBULATORY_CARE_PROVIDER_SITE_OTHER)
Admission: EM | Admit: 2014-01-22 | Discharge: 2014-01-22 | Disposition: A | Discharge status: Home / Self Care | Payer: 59 | Source: Home / Self Care | Attending: Family Medicine | Admitting: Family Medicine

## 2014-01-22 ENCOUNTER — Encounter (HOSPITAL_COMMUNITY): Payer: Self-pay | Admitting: Emergency Medicine

## 2014-01-22 DIAGNOSIS — IMO0002 Reserved for concepts with insufficient information to code with codable children: Secondary | ICD-10-CM

## 2014-01-22 DIAGNOSIS — M5410 Radiculopathy, site unspecified: Secondary | ICD-10-CM

## 2014-01-22 MED ORDER — PREDNISONE 10 MG PO KIT: PACK | ORAL | Status: DC

## 2014-01-22 MED ORDER — TRAMADOL HCL 50 MG PO TABS: 50.0000 mg | ORAL_TABLET | Freq: Four times a day (QID) | ORAL | Status: DC | PRN

## 2014-01-22 NOTE — Discharge Instructions (Signed)
Thank you for coming in today. Prednisone daily for 5 days. Use tramadol for severe pain. Followup with your primary care doctor or Dr. Tamala Julian at [facility] sports medicine if not getting better. Come back or go to the emergency room if you notice new weakness new numbness problems walking or bowel or bladder problems.

## 2014-01-22 NOTE — ED Notes (Signed)
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

## 2014-01-22 NOTE — ED Provider Notes (Signed)
Megan Bullock is a 48 y.o. female who presents to Urgent Care today for left medial thigh pain present for 3 days. Patient has pain is worse with activity but does occur rest as well. She notes the pain is located from her left groin to her left knee on the medial side of her thigh. She notes a mild tingling sensations. She denies any injury. She denies any fevers or chills radiating pain weakness or numbness.   Past Medical History  Diagnosis Date  . FH: breast cancer     mother and grandmother  . Asthma   . Hypothyroidism   . Tobacco use disorder   . Palpitations 03/2012    cardiac consult, echocardiogram, holter monitor - Dr. Wynonia Lawman  . Smoker    History  Substance Use Topics  . Smoking status: Current Every Day Smoker -- 0.25 packs/day for 27 years  . Smokeless tobacco: Never Used  . Alcohol Use: No   ROS as above Medications: No current facility-administered medications for this encounter.   Current Outpatient Prescriptions  Medication Sig Dispense Refill  . atorvastatin (LIPITOR) 20 MG tablet Take 1 tablet (20 mg total) by mouth daily.  90 tablet  0  . levothyroxine (SYNTHROID, LEVOTHROID) 150 MCG tablet Take 1 tablet (150 mcg total) by mouth daily. Branded generic please  90 tablet  0  . cyclobenzaprine (FLEXERIL) 10 MG tablet 1/2-1 tablet po QHS prn  30 tablet  0  . diclofenac (VOLTAREN) 75 MG EC tablet Take 1 tablet (75 mg total) by mouth 2 (two) times daily.  30 tablet  0  . fluticasone (FLONASE) 50 MCG/ACT nasal spray INHALE 2 SPRAYS INTO EACH NOSTRIL DAILY  16 g  0  . fluticasone (FLONASE) 50 MCG/ACT nasal spray Place 1 spray into both nostrils daily.  16 g  0  . Melatonin 1 MG TABS Take 1 tablet (1 mg total) by mouth at bedtime.  30 tablet  2  . PredniSONE 10 MG KIT 12 day dose pack po  1 kit  0  . Prenatal Vit-Fe Fumarate-FA (PRENATAL MULTIVITAMIN) TABS Take 1 tablet by mouth daily.      . traMADol (ULTRAM) 50 MG tablet Take 1 tablet (50 mg total) by mouth every 6 (six)  hours as needed.  15 tablet  0  . varenicline (CHANTIX STARTING MONTH PAK) 0.5 MG X 11 & 1 MG X 42 tablet Take one 0.5 mg tablet by mouth once daily for 3 days, then increase to one 0.5 mg tablet twice daily for 4 days, then increase to one 1 mg tablet twice daily.  53 tablet  0    Exam:  BP 155/88  Pulse 76  Temp(Src) 99.2 F (37.3 C) (Oral)  Resp 18  SpO2 99%  LMP 01/01/2014 Gen: Well NAD Back: Nontender to spinal midline normal back range of motion. Hips bilaterally are normal nontender and completely pain-free range of motion. Negative Corky Sox and FADIR tests Strength is intact straight leg raise and toe out straight leg raise Left thigh is nontender to palpation with no masses palpated. Strength is intact throughout.   No results found for this or any previous visit (from the past 24 hour(s)). Dg Femur Left  01/22/2014   CLINICAL DATA:  Pain for 3 days, no known injury  EXAM: LEFT FEMUR - 2 VIEW  COMPARISON:  None.  FINDINGS: There is no evidence of fracture or other focal bone lesions. Soft tissues are unremarkable.  IMPRESSION: Negative.   Electronically Signed  By: Lahoma Crocker M.D.   On: 01/22/2014 17:50    Assessment and Plan: 48 y.o. female with probable left medial leg radicular pain. Plan to treat with prednisone Dosepak and tramadol. Followup with Dr. Anola Gurney sports medicine if not improving  Discussed warning signs or symptoms. Please see discharge instructions. Patient expresses understanding.    Gregor Hams, MD 01/22/14 [phone]

## 2014-01-28 ENCOUNTER — Telehealth: Payer: Self-pay | Admitting: Family Medicine

## 2014-01-28 NOTE — Telephone Encounter (Signed)
I called pt reached voice mail re er follow up and why er in leiu of here.

## 2014-02-20 ENCOUNTER — Ambulatory Visit (INDEPENDENT_AMBULATORY_CARE_PROVIDER_SITE_OTHER): Payer: 59 | Admitting: Medical

## 2014-02-20 ENCOUNTER — Encounter: Payer: Self-pay | Admitting: Medical

## 2014-02-20 VITALS — BP 130/90 | HR 80 | Temp 98.6°F | Resp 20 | Wt 186.0 lb

## 2014-02-20 DIAGNOSIS — E782 Mixed hyperlipidemia: Secondary | ICD-10-CM

## 2014-02-20 DIAGNOSIS — E669 Obesity, unspecified: Secondary | ICD-10-CM

## 2014-02-20 DIAGNOSIS — J309 Allergic rhinitis, unspecified: Secondary | ICD-10-CM

## 2014-02-20 DIAGNOSIS — F17200 Nicotine dependence, unspecified, uncomplicated: Secondary | ICD-10-CM

## 2014-02-20 DIAGNOSIS — E039 Hypothyroidism, unspecified: Secondary | ICD-10-CM

## 2014-02-20 DIAGNOSIS — B351 Tinea unguium: Secondary | ICD-10-CM

## 2014-02-20 DIAGNOSIS — Z1239 Encounter for other screening for malignant neoplasm of breast: Secondary | ICD-10-CM

## 2014-02-20 DIAGNOSIS — Z0000 Encounter for general adult medical examination without abnormal findings: Secondary | ICD-10-CM

## 2014-02-20 DIAGNOSIS — J302 Other seasonal allergic rhinitis: Secondary | ICD-10-CM

## 2014-02-20 LAB — COMPREHENSIVE METABOLIC PANEL
ALT: 14 U/L (ref 0–35)
AST: 13 U/L (ref 0–37)
Albumin: 4.3 g/dL (ref 3.5–5.2)
Alkaline Phosphatase: 55 U/L (ref 39–117)
BUN: 9 mg/dL (ref 6–23)
CALCIUM: 9.4 mg/dL (ref 8.4–10.5)
CHLORIDE: 107 meq/L (ref 96–112)
CO2: 25 meq/L (ref 19–32)
CREATININE: 0.98 mg/dL (ref 0.50–1.10)
Glucose, Bld: 83 mg/dL (ref 70–99)
Potassium: 4.1 mEq/L (ref 3.5–5.3)
Sodium: 138 mEq/L (ref 135–145)
Total Bilirubin: 0.4 mg/dL (ref 0.2–1.2)
Total Protein: 6.6 g/dL (ref 6.0–8.3)

## 2014-02-20 LAB — CBC
HEMATOCRIT: 36.5 % (ref 36.0–46.0)
HEMOGLOBIN: 12.5 g/dL (ref 12.0–15.0)
MCH: 31.3 pg (ref 26.0–34.0)
MCHC: 34.2 g/dL (ref 30.0–36.0)
MCV: 91.5 fL (ref 78.0–100.0)
PLATELETS: 172 10*3/uL (ref 150–400)
RBC: 3.99 MIL/uL (ref 3.87–5.11)
RDW: 15 % (ref 11.5–15.5)
WBC: 6.6 10*3/uL (ref 4.0–10.5)

## 2014-02-20 LAB — LIPID PANEL
CHOL/HDL RATIO: 4.5 ratio
CHOLESTEROL: 174 mg/dL (ref 0–200)
HDL: 39 mg/dL — ABNORMAL LOW (ref >39)
LDL Cholesterol: 102 mg/dL — ABNORMAL HIGH (ref 0–99)
Triglycerides: 163 mg/dL — ABNORMAL HIGH (ref <150)
VLDL: 33 mg/dL (ref 0–40)

## 2014-02-20 LAB — POCT URINALYSIS DIPSTICK
BILIRUBIN UA: NEGATIVE
GLUCOSE UA: NEGATIVE
Ketones, UA: NEGATIVE
LEUKOCYTES UA: NEGATIVE
NITRITE UA: NEGATIVE
Protein, UA: NEGATIVE
RBC UA: NEGATIVE
Spec Grav, UA: 1.015
Urobilinogen, UA: NEGATIVE
pH, UA: 7

## 2014-02-20 MED ORDER — VARENICLINE TARTRATE 0.5 MG X 11 & 1 MG X 42 PO MISC: ORAL | Status: DC

## 2014-02-20 MED ORDER — TERBINAFINE HCL 250 MG PO TABS: 250.0000 mg | ORAL_TABLET | Freq: Every day | ORAL | Status: DC

## 2014-02-20 NOTE — Assessment & Plan Note (Signed)
Currently exercising 7 days per week, trying to lose weight, using My Fitness Pal to help track calories.  Just started using 1200 calories as a goal

## 2014-02-20 NOTE — Addendum Note (Signed)
Addended by: Pernell Dupre A on: 02/20/2014 09:49 AM   Modules accepted: Orders

## 2014-02-20 NOTE — Assessment & Plan Note (Signed)
Compliant with lipitor without c/o.  Eating healthy.   Avoiding lots of red meat

## 2014-02-20 NOTE — Assessment & Plan Note (Signed)
flonase works fine, uses this year round.  Has 2 dogs
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
## 2014-02-20 NOTE — Assessment & Plan Note (Signed)
Currently smoking cigars, 4 times per week.  Has cut out cigarettes.  Did fine on Chantix last time.   Would like refill on this.

## 2014-02-20 NOTE — Assessment & Plan Note (Signed)
Has missed some tablets since last recheck on thyroid > 1 year ago.  No changes in fatigue, nails, hair, palpitations

## 2014-02-20 NOTE — Patient Instructions (Signed)
  Thank you for giving me the opportunity to serve you today.    Your diagnosis today includes: Encounter Diagnoses  Name Primary?  . Routine general medical examination at a health care facility Yes  . Tobacco use disorder   . Obesity, unspecified   . HYPOTHYROIDISM   . Allergic rhinitis, seasonal   . Mixed dyslipidemia   . Screening for breast cancer      Specific recommendations today include:  Continue efforts to stop smoking, I refilled Chantix today  Continue efforts at weight loss through healthy diet and exercise  As spring approaches if you start having problems with cough, wheezing, shortness of breath, then return to discuss additional medicines to help keep asthma under control  Go for mammogram next month  We will call with lab results  Follow up: pending labs

## 2014-02-21 ENCOUNTER — Other Ambulatory Visit: Payer: Self-pay | Admitting: Medical

## 2014-02-21 LAB — TSH: TSH: 43.553 u[IU]/mL — ABNORMAL HIGH (ref 0.350–4.500)

## 2014-02-21 LAB — T4, FREE: FREE T4: 0.86 ng/dL (ref 0.80–1.80)

## 2014-02-21 MED ORDER — FLUTICASONE PROPIONATE 50 MCG/ACT NA SUSP: 1.0000 | Freq: Every day | NASAL | Status: DC

## 2014-02-21 MED ORDER — ATORVASTATIN CALCIUM 20 MG PO TABS: 20.0000 mg | ORAL_TABLET | Freq: Every day | ORAL | Status: DC

## 2014-02-22 ENCOUNTER — Other Ambulatory Visit: Payer: Self-pay | Admitting: Medical

## 2014-02-22 DIAGNOSIS — E039 Hypothyroidism, unspecified: Secondary | ICD-10-CM

## 2014-02-22 MED ORDER — LEVOTHYROXINE SODIUM 175 MCG PO TABS: 175.0000 ug | ORAL_TABLET | Freq: Every day | ORAL | Status: DC

## 2014-02-26 MED ORDER — LEVOTHYROXINE SODIUM 175 MCG PO TABS: 175.0000 ug | ORAL_TABLET | Freq: Every day | ORAL | Status: DC

## 2014-03-20 ENCOUNTER — Other Ambulatory Visit: Payer: 59

## 2014-03-20 DIAGNOSIS — E039 Hypothyroidism, unspecified: Secondary | ICD-10-CM

## 2014-03-20 LAB — TSH: TSH: 0.315 u[IU]/mL — ABNORMAL LOW (ref 0.350–4.500)

## 2014-03-20 LAB — T4, FREE: Free T4: 2.13 ng/dL — ABNORMAL HIGH (ref 0.80–1.80)

## 2014-03-21 ENCOUNTER — Other Ambulatory Visit: Payer: Self-pay | Admitting: Medical

## 2014-03-21 ENCOUNTER — Telehealth: Payer: Self-pay

## 2014-03-21 MED ORDER — LEVOTHYROXINE SODIUM 150 MCG PO TABS: 150.0000 ug | ORAL_TABLET | Freq: Every day | ORAL | Status: DC

## 2014-03-21 NOTE — Telephone Encounter (Signed)
Message copied by Louie Bun on Thu Mar 21, 2014 10:00 AM ------      Message from: Carlena Hurl      Created: Thu Mar 21, 2014  7:36 AM       Thyroid is overcorrected, so go back down to 150mg .  I sent script for this, but check closet too as we sometimes get samples. ------

## 2014-03-29 LAB — HM MAMMOGRAPHY: HM Mammogram: NEGATIVE

## 2014-04-01 ENCOUNTER — Encounter: Payer: Self-pay | Admitting: Internal Medicine

## 2014-04-04 NOTE — Telephone Encounter (Signed)
Patient notified to use Synthroid 150/d per Audelia Acton. [facility]

## 2014-04-10 ENCOUNTER — Telehealth: Payer: Self-pay

## 2014-04-10 MED ORDER — LEVOTHYROXINE SODIUM 175 MCG PO TABS: 175.0000 ug | ORAL_TABLET | Freq: Every day | ORAL | Status: DC

## 2014-04-10 NOTE — Telephone Encounter (Signed)
Refill request for Levothyroxine 124mcg tablet #30

## 2014-04-29 ENCOUNTER — Ambulatory Visit (INDEPENDENT_AMBULATORY_CARE_PROVIDER_SITE_OTHER): Payer: 59 | Admitting: Medical

## 2014-04-29 ENCOUNTER — Encounter: Payer: Self-pay | Admitting: Medical

## 2014-04-29 VITALS — BP 128/88 | HR 78 | Temp 98.1°F | Resp 16 | Wt 183.0 lb

## 2014-04-29 DIAGNOSIS — IMO0002 Reserved for concepts with insufficient information to code with codable children: Secondary | ICD-10-CM

## 2014-04-29 DIAGNOSIS — E039 Hypothyroidism, unspecified: Secondary | ICD-10-CM

## 2014-04-29 DIAGNOSIS — M549 Dorsalgia, unspecified: Secondary | ICD-10-CM

## 2014-04-29 DIAGNOSIS — M5410 Radiculopathy, site unspecified: Secondary | ICD-10-CM

## 2014-04-29 MED ORDER — NAPROXEN 500 MG PO TABS: 500.0000 mg | ORAL_TABLET | Freq: Two times a day (BID) | ORAL | Status: DC

## 2014-04-29 MED ORDER — TRAMADOL HCL 50 MG PO TABS: 50.0000 mg | ORAL_TABLET | Freq: Four times a day (QID) | ORAL | Status: DC | PRN

## 2014-04-29 MED ORDER — CYCLOBENZAPRINE HCL 10 MG PO TABS: 10.0000 mg | ORAL_TABLET | Freq: Every day | ORAL | Status: DC

## 2014-04-29 NOTE — Patient Instructions (Signed)
  Thank you for giving me the opportunity to serve you today.    Your diagnosis today includes: Encounter Diagnoses  Name Primary?  . Radicular pain of left lower extremity Yes  . Back pain      Specific recommendations today include:  These symptoms suggest possible inflammation of nerve root in the lumbar spine or possible bulging disc.    Use Naprosyn twice daily for pain and inflammation for the next week  Use Flexeril muscles relaxer, 1/2-1 tablet as needed, preferably at bedtime since this can cause drowsiness  If needed for worse pain, use Tramadol.  Use gentle stretching  Heat to the low back if desired  If not much improved in 2 weeks, we will get imaging and pursue next steps.  Recheck 1-2 weeks.
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
## 2014-04-30 LAB — TSH: TSH: 0.303 u[IU]/mL — AB (ref 0.350–4.500)

## 2014-04-30 LAB — T4, FREE: FREE T4: 1.73 ng/dL (ref 0.80–1.80)

## 2014-05-01 ENCOUNTER — Other Ambulatory Visit: Payer: Self-pay | Admitting: Family Medicine

## 2014-05-01 MED ORDER — LEVOTHYROXINE SODIUM 175 MCG PO TABS: 150.0000 ug | ORAL_TABLET | Freq: Every day | ORAL | Status: DC

## 2014-05-30 ENCOUNTER — Encounter (HOSPITAL_COMMUNITY): Payer: Self-pay | Admitting: Emergency Medicine

## 2014-05-30 ENCOUNTER — Emergency Department (INDEPENDENT_AMBULATORY_CARE_PROVIDER_SITE_OTHER)
Admission: EM | Admit: 2014-05-30 | Discharge: 2014-05-30 | Disposition: A | Discharge status: Home / Self Care | Payer: 59 | Source: Home / Self Care

## 2014-05-30 DIAGNOSIS — H109 Unspecified conjunctivitis: Secondary | ICD-10-CM

## 2014-05-30 MED ORDER — OFLOXACIN 0.3 % OP SOLN: 1.0000 [drp] | Freq: Four times a day (QID) | OPHTHALMIC | Status: DC

## 2014-05-30 MED ORDER — DOXYCYCLINE HYCLATE 100 MG PO CAPS: 100.0000 mg | ORAL_CAPSULE | Freq: Two times a day (BID) | ORAL | Status: DC
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
## 2014-05-30 NOTE — ED Provider Notes (Signed)
Medical screening examination/treatment/procedure(s) were performed by resident physician or non-physician practitioner and as supervising physician I was immediately available for consultation/collaboration.   Pauline Good MD.   Billy Fischer, MD 05/30/14 2104

## 2014-05-30 NOTE — ED Notes (Signed)
Patient complains of left eye red with discharge Started yesterday  Eye lid is swollen as well

## 2014-05-30 NOTE — Discharge Instructions (Signed)
Conjunctivitis Conjunctivitis is commonly called "pink eye." Conjunctivitis can be caused by bacterial or viral infection, allergies, or injuries. There is usually redness of the lining of the eye, itching, discomfort, and sometimes discharge. There may be deposits of matter along the eyelids. A viral infection usually causes a watery discharge, while a bacterial infection causes a yellowish, thick discharge. Pink eye is very contagious and spreads by direct contact. You may be given antibiotic eyedrops as part of your treatment. Before using your eye medicine, remove all drainage from the eye by washing gently with warm water and cotton balls. Continue to use the medication until you have awakened 2 mornings in a row without discharge from the eye. Do not rub your eye. This increases the irritation and helps spread infection. Use separate towels from other household members. Wash your hands with soap and water before and after touching your eyes. Use cold compresses to reduce pain and sunglasses to relieve irritation from light. Do not wear contact lenses or wear eye makeup until the infection is gone. SEEK MEDICAL CARE IF:   Your symptoms are not better after 3 days of treatment.  You have increased pain or trouble seeing.  The outer eyelids become very red or swollen. Document Released: 01/20/2005 Document Revised: 03/06/2012 Document Reviewed: 12/13/2005 [facility] Patient Information 2014 [place].   FYI Orbital Cellulitis  Orbital cellulitis is an infection of the soft tissue of the orbit without abscess formation. The eye socket is the area around and behind the eye. It usually comes on suddenly in children, but can happen at any age. This condition can lead to death if untreated.  CAUSES   A germ (bacterial) infection of the area around and behind the eye.  Long-term (chronic) sinus infections.  An object (foreign body) stuck behind the eye.  An injury that goes through  (penetrates) to tissues of the eyelids.  Trauma with secondary infection.  Fracture of the boney wall or floor of the orbit.  Serious eyelid infections.  Bite wounds.  Inflammation or infection of the lining membranes of the brain (meningitis).  Blood poisoning or infection (septicemia).  Dental area filled with pus (abscesses).  Severe uncontrolled diabetes (ketoacidosis). SYMPTOMS   Pain in the eye.  Redness and puffiness (swelling) of the eyelids. The swelling is often bad enough that the eye has to close.  Fever and feeling generally ill.  The lids and the cheek may be very red, hot and swollen.  A drop in vision.  Pain when touching the area around the eye.  The eye itself may look like it is "popping out" (proptosis).  Double vision  seeing two of everything (diplopia). DIAGNOSIS  An ophthalmologist can tell you if you have orbital cellulitis during an eye exam. It is important to know if the infection goes into the area behind the eye. True orbital cellulitis is a medical emergency. A CT scan may be needed to see if the sinuses are involved. A CT scan can also see if an abscess has formed behind the eye. TREATMENT  Orbital cellulitis should be treated in the hospital with medicine that kills germs (antibiotics). These antibiotics are given right into the bloodstream through a vein (intravenous). SEEK IMMEDIATE MEDICAL CARE IF:   You have red, swollen eyelids.  You have a fever.  You develop double vision. MAKE SURE YOU:   Understand these instructions.  Will watch your condition.  Will get help right away if you are not doing well or get  worse. Document Released: 12/07/2001 Document Revised: 03/06/2012 Document Reviewed: 04/08/2008 [facility] Patient Information 2014 [place].

## 2014-06-11 ENCOUNTER — Telehealth: Payer: Self-pay | Admitting: Family Medicine

## 2014-06-11 NOTE — Telephone Encounter (Signed)
ER letter sent 

## 2014-06-27 ENCOUNTER — Encounter: Payer: Self-pay | Admitting: Medical

## 2014-06-27 ENCOUNTER — Ambulatory Visit (INDEPENDENT_AMBULATORY_CARE_PROVIDER_SITE_OTHER): Payer: 59 | Admitting: Medical

## 2014-06-27 VITALS — BP 120/80 | HR 76 | Temp 98.4°F | Resp 16 | Wt 182.0 lb

## 2014-06-27 DIAGNOSIS — F341 Dysthymic disorder: Secondary | ICD-10-CM

## 2014-06-27 DIAGNOSIS — G4700 Insomnia, unspecified: Secondary | ICD-10-CM

## 2014-06-27 DIAGNOSIS — F418 Other specified anxiety disorders: Secondary | ICD-10-CM

## 2014-06-27 MED ORDER — CITALOPRAM HYDROBROMIDE 20 MG PO TABS: 20.0000 mg | ORAL_TABLET | Freq: Every day | ORAL | Status: DC

## 2014-06-27 MED ORDER — ALPRAZOLAM 0.5 MG PO TABS: 0.5000 mg | ORAL_TABLET | Freq: Every evening | ORAL | Status: DC | PRN

## 2014-06-27 NOTE — Progress Notes (Signed)
  Subjective:     Megan Bullock is a 48 y.o. female who presents for depression and anxiety.  Recently established with [facility] Ph. D. For counseling, diagnosed with anxiety, depression, insomnia.  advised she come in here for medication.  Was advised to begin SSRI/SNRI, Xanax prn, Trazodone for sleep per psychologist recommendations.    Current symptoms include depressed mood, difficulty concentrating, fatigue, impaired memory, insomnia and psychomotor agitation. Symptoms have been gradually worsening since that time. Patient denies feelings of worthlessness/guilt and SI/HI. Previous treatment includes: individual therapy.  This month is the month/time of year her mother started getting sick before her eventual death in April 19, 2009.  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  Having crying spells.  Her boyfriend of 20 years has currently been incarcerated for the past year.  Her ex-husband is also incarcerated, but has to deal with him as well.  She lives with her 2yo daughter and 18 yo son who just moved back in.   Her psychologist took her out of work for a month to help cope and deal with her stressors and mood.  She is not sleeping, gets up 4am every day, can't get back to sleep.  Is fatigued, lack of focus, needs help.  No other aggravating or relieving factors.  No other c/o.   Review of Systems General: No recent weight change, no anorexia Neuro: No sleep changes Psychology: Denies suicidal ideation, hallucinations, alcohol use, drug use  Objective:    BP 120/80  Pulse 76  Temp(Src) 98.4 F (36.9 C) (Oral)  Resp 16  Wt 182 lb (82.555 kg)  LMP 06/25/2014   General appearance: alert, no distress, WD/WN, female Heart: RRR, normal S1, S2, no murmurs Lungs: CTA bilaterally, no wheezes, rhonchi, or rales Psychiatric: normal affect, behavior normal, pleasant, normal hygiene and grooming.        Assessment:   Encounter Diagnoses  Name Primary?  . Depression  with anxiety Yes  . Insomnia     Plan:    Discussed symptoms and concerns.  Discussed risks/benefits of  Medications.   Medications:  Begin Citalopram QHS, Xanax prn for sleep or panic feeling.        Consider counseling with Psychologist [facility]. Recheck in 2-3 wk.  Return or call sooner if worse or no improvement.  Greater than 30 min spent face-to-face discussing concerns, evaluation, treatment plan, and followup.

## 2014-06-27 NOTE — Patient Instructions (Signed)
  Thank you for giving me the opportunity to serve you today.    Your diagnosis today includes: Encounter Diagnoses  Name Primary?  . Depression with anxiety Yes  . Insomnia      Specific recommendations today include:  Begin Citalopram 20mg , 1/2 tablet at bedtime daily for 1 week, then use 1 tablet at bedtime daily  You may use Xanax, 1 tablet as need for sleep or panic feeling.  Continue with counseling  I want to see you back in 2-3 weeks.     Call or return if needed sooner.

## 2014-10-14 ENCOUNTER — Telehealth: Payer: Self-pay | Admitting: Medical

## 2014-10-14 NOTE — Telephone Encounter (Signed)
Need recheck OV.  Last visit we started a medication, so needs f/u.  If going to run out completley within the next 2 week, we can call out 30 days.

## 2014-10-14 NOTE — Telephone Encounter (Signed)
I left a message on her voicemail to call to schedule a OV

## 2014-10-21 ENCOUNTER — Other Ambulatory Visit: Payer: Self-pay | Admitting: Medical

## 2014-10-22 NOTE — Telephone Encounter (Signed)
Is this okay to refill and she is requesting 90 day supply

## 2014-11-11 ENCOUNTER — Telehealth: Payer: Self-pay | Admitting: Medical

## 2014-11-11 MED ORDER — LEVOTHYROXINE SODIUM 175 MCG PO TABS: 175.0000 ug | ORAL_TABLET | Freq: Every day | ORAL | Status: DC

## 2014-11-11 NOTE — Telephone Encounter (Signed)
done

## 2014-11-23 ENCOUNTER — Emergency Department (HOSPITAL_COMMUNITY): Payer: 59

## 2014-11-23 ENCOUNTER — Emergency Department (HOSPITAL_COMMUNITY)
Admission: EM | Admit: 2014-11-23 | Discharge: 2014-11-24 | Disposition: A | Discharge status: Home / Self Care | Payer: 59 | Attending: Emergency Medicine | Admitting: Emergency Medicine

## 2014-11-23 ENCOUNTER — Encounter (HOSPITAL_COMMUNITY): Payer: Self-pay | Admitting: *Deleted

## 2014-11-23 DIAGNOSIS — Z7951 Long term (current) use of inhaled steroids: Secondary | ICD-10-CM | POA: Insufficient documentation

## 2014-11-23 DIAGNOSIS — Z720 Tobacco use: Secondary | ICD-10-CM | POA: Insufficient documentation

## 2014-11-23 DIAGNOSIS — Z79899 Other long term (current) drug therapy: Secondary | ICD-10-CM | POA: Insufficient documentation

## 2014-11-23 DIAGNOSIS — R0789 Other chest pain: Secondary | ICD-10-CM | POA: Insufficient documentation

## 2014-11-23 DIAGNOSIS — Z853 Personal history of malignant neoplasm of breast: Secondary | ICD-10-CM | POA: Insufficient documentation

## 2014-11-23 DIAGNOSIS — Z973 Presence of spectacles and contact lenses: Secondary | ICD-10-CM | POA: Diagnosis not present

## 2014-11-23 DIAGNOSIS — E039 Hypothyroidism, unspecified: Secondary | ICD-10-CM | POA: Insufficient documentation

## 2014-11-23 DIAGNOSIS — Z792 Long term (current) use of antibiotics: Secondary | ICD-10-CM | POA: Diagnosis not present

## 2014-11-23 DIAGNOSIS — J45909 Unspecified asthma, uncomplicated: Secondary | ICD-10-CM | POA: Insufficient documentation

## 2014-11-23 DIAGNOSIS — E669 Obesity, unspecified: Secondary | ICD-10-CM | POA: Insufficient documentation

## 2014-11-23 DIAGNOSIS — R079 Chest pain, unspecified: Secondary | ICD-10-CM

## 2014-11-23 LAB — CBC
HCT: 35.2 % — ABNORMAL LOW (ref 36.0–46.0)
HEMOGLOBIN: 11.6 g/dL — AB (ref 12.0–15.0)
MCH: 28.9 pg (ref 26.0–34.0)
MCHC: 33 g/dL (ref 30.0–36.0)
MCV: 87.6 fL (ref 78.0–100.0)
Platelets: 162 10*3/uL (ref 150–400)
RBC: 4.02 MIL/uL (ref 3.87–5.11)
RDW: 15.4 % (ref 11.5–15.5)
WBC: 7.3 10*3/uL (ref 4.0–10.5)

## 2014-11-23 LAB — BASIC METABOLIC PANEL
Anion gap: 11 (ref 5–15)
BUN: 9 mg/dL (ref 6–23)
CALCIUM: 9.7 mg/dL (ref 8.4–10.5)
CO2: 23 mEq/L (ref 19–32)
Chloride: 107 mEq/L (ref 96–112)
Creatinine, Ser: 0.9 mg/dL (ref 0.50–1.10)
GFR calc Af Amer: 86 mL/min — ABNORMAL LOW (ref >90)
GFR calc non Af Amer: 74 mL/min — ABNORMAL LOW (ref >90)
GLUCOSE: 91 mg/dL (ref 70–99)
Potassium: 4.1 mEq/L (ref 3.7–5.3)
SODIUM: 141 meq/L (ref 137–147)

## 2014-11-23 LAB — I-STAT TROPONIN, ED: TROPONIN I, POC: 0 ng/mL (ref 0.00–0.08)

## 2014-11-23 NOTE — ED Notes (Signed)
Pt in c/o central chest pain that started around 1pm, pain has been intermittent, denies other symptoms with this, no distress noted
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
## 2014-11-24 MED ORDER — NAPROXEN 500 MG PO TABS: 500.0000 mg | ORAL_TABLET | Freq: Two times a day (BID) | ORAL | Status: DC

## 2014-11-24 MED ORDER — FAMOTIDINE 20 MG PO TABS: 20.0000 mg | ORAL_TABLET | Freq: Two times a day (BID) | ORAL | Status: DC

## 2014-11-24 NOTE — Discharge Instructions (Signed)
Your testing has been normal - see your doctor  Naprosyn twice a day Pepcid daily  Please call your doctor for a followup appointment within 24-48 hours. When you talk to your doctor please let them know that you were seen in the emergency department and have them acquire all of your records so that they can discuss the findings with you and formulate a treatment plan to fully care for your new and ongoing problems.   Chest Pain (Nonspecific) It is often hard to give a specific diagnosis for the cause of chest pain. There is always a chance that your pain could be related to something serious, such as a heart attack or a blood clot in the lungs. You need to follow up with your health care provider for further evaluation. CAUSES   Heartburn.  Pneumonia or bronchitis.  Anxiety or stress.  Inflammation around your heart (pericarditis) or lung (pleuritis or pleurisy).  A blood clot in the lung.  A collapsed lung (pneumothorax). It can develop suddenly on its own (spontaneous pneumothorax) or from trauma to the chest.  Shingles infection (herpes zoster virus). The chest wall is composed of bones, muscles, and cartilage. Any of these can be the source of the pain.  The bones can be bruised by injury.  The muscles or cartilage can be strained by coughing or overwork.  The cartilage can be affected by inflammation and become sore (costochondritis). DIAGNOSIS  Lab tests or other studies may be needed to find the cause of your pain. Your health care provider may have you take a test called an ambulatory electrocardiogram (ECG). An ECG records your heartbeat patterns over a 24-hour period. You may also have other tests, such as:  Transthoracic echocardiogram (TTE). During echocardiography, sound waves are used to evaluate how blood flows through your heart.  Transesophageal echocardiogram (TEE).  Cardiac monitoring. This allows your health care provider to monitor your heart rate and  rhythm in real time.  Holter monitor. This is a portable device that records your heartbeat and can help diagnose heart arrhythmias. It allows your health care provider to track your heart activity for several days, if needed.  Stress tests by exercise or by giving medicine that makes the heart beat faster. TREATMENT   Treatment depends on what may be causing your chest pain. Treatment may include:  Acid blockers for heartburn.  Anti-inflammatory medicine.  Pain medicine for inflammatory conditions.  Antibiotics if an infection is present.  You may be advised to change lifestyle habits. This includes stopping smoking and avoiding alcohol, caffeine, and chocolate.  You may be advised to keep your head raised (elevated) when sleeping. This reduces the chance of acid going backward from your stomach into your esophagus. Most of the time, nonspecific chest pain will improve within 2-3 days with rest and mild pain medicine.  HOME CARE INSTRUCTIONS   If antibiotics were prescribed, take them as directed. Finish them even if you start to feel better.  For the next few days, avoid physical activities that bring on chest pain. Continue physical activities as directed.  Do not use any tobacco products, including cigarettes, chewing tobacco, or electronic cigarettes.  Avoid drinking alcohol.  Only take medicine as directed by your health care provider.  Follow your health care provider's suggestions for further testing if your chest pain does not go away.  Keep any follow-up appointments you made. If you do not go to an appointment, you could develop lasting (chronic) problems with pain. If there  is any problem keeping an appointment, call to reschedule. SEEK MEDICAL CARE IF:   Your chest pain does not go away, even after treatment.  You have a rash with blisters on your chest.  You have a fever. SEEK IMMEDIATE MEDICAL CARE IF:   You have increased chest pain or pain that spreads to  your arm, neck, jaw, back, or abdomen.  You have shortness of breath.  You have an increasing cough, or you cough up blood.  You have severe back or abdominal pain.  You feel nauseous or vomit.  You have severe weakness.  You faint.  You have chills. This is an emergency. Do not wait to see if the pain will go away. Get medical help at once. Call your local emergency services (911 in [place] Do not drive yourself to the hospital. MAKE SURE YOU:   Understand these instructions.  Will watch your condition.  Will get help right away if you are not doing well or get worse. Document Released: 09/22/2005 Document Revised: 12/18/2013 Document Reviewed: 07/18/2008 [facility] Patient Information 2015 [place], [place]. This information is not intended to replace advice given to you by your health care provider. Make sure you discuss any questions you have with your health care provider.

## 2015-01-04 ENCOUNTER — Encounter (HOSPITAL_COMMUNITY): Payer: Self-pay | Admitting: Emergency Medicine

## 2015-01-04 ENCOUNTER — Emergency Department (HOSPITAL_COMMUNITY)
Admission: EM | Admit: 2015-01-04 | Discharge: 2015-01-04 | Disposition: A | Discharge status: Home / Self Care | Payer: 59 | Attending: Emergency Medicine | Admitting: Emergency Medicine

## 2015-01-04 ENCOUNTER — Emergency Department (HOSPITAL_COMMUNITY): Payer: 59

## 2015-01-04 DIAGNOSIS — S134XXA Sprain of ligaments of cervical spine, initial encounter: Secondary | ICD-10-CM | POA: Insufficient documentation

## 2015-01-04 DIAGNOSIS — Z720 Tobacco use: Secondary | ICD-10-CM | POA: Diagnosis not present

## 2015-01-04 DIAGNOSIS — Z853 Personal history of malignant neoplasm of breast: Secondary | ICD-10-CM | POA: Insufficient documentation

## 2015-01-04 DIAGNOSIS — Y9389 Activity, other specified: Secondary | ICD-10-CM | POA: Insufficient documentation

## 2015-01-04 DIAGNOSIS — S139XXA Sprain of joints and ligaments of unspecified parts of neck, initial encounter: Secondary | ICD-10-CM

## 2015-01-04 DIAGNOSIS — E782 Mixed hyperlipidemia: Secondary | ICD-10-CM | POA: Diagnosis not present

## 2015-01-04 DIAGNOSIS — Z79899 Other long term (current) drug therapy: Secondary | ICD-10-CM | POA: Diagnosis not present

## 2015-01-04 DIAGNOSIS — S79912A Unspecified injury of left hip, initial encounter: Secondary | ICD-10-CM | POA: Diagnosis not present

## 2015-01-04 DIAGNOSIS — S79922A Unspecified injury of left thigh, initial encounter: Secondary | ICD-10-CM | POA: Insufficient documentation

## 2015-01-04 DIAGNOSIS — J45909 Unspecified asthma, uncomplicated: Secondary | ICD-10-CM | POA: Insufficient documentation

## 2015-01-04 DIAGNOSIS — E669 Obesity, unspecified: Secondary | ICD-10-CM | POA: Diagnosis not present

## 2015-01-04 DIAGNOSIS — Y998 Other external cause status: Secondary | ICD-10-CM | POA: Diagnosis not present

## 2015-01-04 DIAGNOSIS — S199XXA Unspecified injury of neck, initial encounter: Secondary | ICD-10-CM | POA: Diagnosis present

## 2015-01-04 DIAGNOSIS — Y92410 Unspecified street and highway as the place of occurrence of the external cause: Secondary | ICD-10-CM | POA: Insufficient documentation

## 2015-01-04 DIAGNOSIS — E039 Hypothyroidism, unspecified: Secondary | ICD-10-CM | POA: Insufficient documentation

## 2015-01-04 MED ORDER — CYCLOBENZAPRINE HCL 10 MG PO TABS: 10.0000 mg | ORAL_TABLET | Freq: Two times a day (BID) | ORAL | Status: DC | PRN

## 2015-01-04 MED ORDER — IBUPROFEN 800 MG PO TABS: 800.0000 mg | ORAL_TABLET | Freq: Three times a day (TID) | ORAL | Status: DC

## 2015-01-04 MED ORDER — HYDROCODONE-ACETAMINOPHEN 5-325 MG PO TABS: 2.0000 | ORAL_TABLET | ORAL | Status: DC | PRN

## 2015-01-04 MED ORDER — IBUPROFEN 800 MG PO TABS
800.0000 mg | ORAL_TABLET | Freq: Once | ORAL | Status: AC
  Administered 2015-01-04: 800 mg via ORAL
  Filled 2015-01-04: qty 1

## 2015-01-04 NOTE — ED Notes (Signed)
Pt. Refused wheelchair and left with all belongings 

## 2015-01-04 NOTE — Discharge Instructions (Signed)
xrays are normal, see below   Cervical Sprain A cervical sprain is when the tissues (ligaments) that hold the neck bones in place stretch or tear. HOME CARE   Put ice on the injured area.  Put ice in a plastic bag.  Place a towel between your skin and the bag.  Leave the ice on for 15-20 minutes, 3-4 times a day.  You may have been given a collar to wear. This collar keeps your neck from moving while you heal.  Do not take the collar off unless told by your doctor.  If you have long hair, keep it outside of the collar.  Ask your doctor before changing the position of your collar. You may need to change its position over time to make it more comfortable.  If you are allowed to take off the collar for cleaning or bathing, follow your doctor's instructions on how to do it safely.  Keep your collar clean by wiping it with mild soap and water. Dry it completely. If the collar has removable pads, remove them every 1-2 days to hand wash them with soap and water. Allow them to air dry. They should be dry before you wear them in the collar.  Do not drive while wearing the collar.  Only take medicine as told by your doctor.  Keep all doctor visits as told.  Keep all physical therapy visits as told.  Adjust your work station so that you have good posture while you work.  Avoid positions and activities that make your problems worse.  Warm up and stretch before being active. GET HELP IF:  Your pain is not controlled with medicine.  You cannot take less pain medicine over time as planned.  Your activity level does not improve as expected. GET HELP RIGHT AWAY IF:   You are bleeding.  Your stomach is upset.  You have an allergic reaction to your medicine.  You develop new problems that you cannot explain.  You lose feeling (become numb) or you cannot move any part of your body (paralysis).  You have tingling or weakness in any part of your body.  Your symptoms get worse.  Symptoms include:  Pain, soreness, stiffness, puffiness (swelling), or a burning feeling in your neck.  Pain when your neck is touched.  Shoulder or upper back pain.  Limited ability to move your neck.  Headache.  Dizziness.  Your hands or arms feel week, lose feeling, or tingle.  Muscle spasms.  Difficulty swallowing or chewing. MAKE SURE YOU:   Understand these instructions.  Will watch your condition.  Will get help right away if you are not doing well or get worse. Document Released: 05/31/2008 Document Revised: 08/15/2013 Document Reviewed: 06/20/2013 [facility] Patient Information 2015 [place], [place]. This information is not intended to replace advice given to you by your health care provider. Make sure you discuss any questions you have with your health care provider.
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
## 2015-03-24 LAB — HM MAMMOGRAPHY

## 2015-03-26 LAB — HM MAMMOGRAPHY

## 2015-03-31 ENCOUNTER — Other Ambulatory Visit: Payer: Self-pay | Admitting: Medical

## 2015-04-01 ENCOUNTER — Encounter: Payer: Self-pay | Admitting: Medical

## 2015-04-02 ENCOUNTER — Encounter: Payer: Self-pay | Admitting: Internal Medicine

## 2015-04-14 ENCOUNTER — Telehealth: Payer: Self-pay | Admitting: Medical

## 2015-04-14 NOTE — Telephone Encounter (Signed)
Imaging suggests a small complex cyst and other simple cyst.   I would suggest she keep an eye on the size of the lump.  If getting significantly bigger in 30 days, then come back in and let me examine.  I would potentially do their recommendation of 35mo ultrasound, but lets say come in to re-examine in 2mo unless bigger sooner.

## 2015-04-14 NOTE — Telephone Encounter (Signed)
Pt had mammogram/ ultrasound last Monday and a lump was found. Pt was told to come back for a recheck in 6 months. She is concerned due to family history of breast cancer. Pt wants to know if Audelia Acton can request further testing now or sooner than 6 months (like a biopsy or something). Also if Audelia Acton schedules anything pt would like to have appts on Mondays since that is her day off work

## 2015-04-15 NOTE — Telephone Encounter (Signed)
Patient is aware of Dorothea Ogle PA message and recommendations. Patient has a follow up appointment her on 04/28/15

## 2015-04-15 NOTE — Telephone Encounter (Signed)
LMOM TO CB. CLS 

## 2015-04-28 ENCOUNTER — Encounter: Payer: Self-pay | Admitting: Medical

## 2015-04-28 ENCOUNTER — Ambulatory Visit (INDEPENDENT_AMBULATORY_CARE_PROVIDER_SITE_OTHER): Payer: 59 | Admitting: Medical

## 2015-04-28 VITALS — BP 132/90 | HR 89 | Resp 15 | Wt 185.0 lb

## 2015-04-28 DIAGNOSIS — F17200 Nicotine dependence, unspecified, uncomplicated: Secondary | ICD-10-CM

## 2015-04-28 DIAGNOSIS — G4700 Insomnia, unspecified: Secondary | ICD-10-CM | POA: Diagnosis not present

## 2015-04-28 DIAGNOSIS — R202 Paresthesia of skin: Secondary | ICD-10-CM | POA: Diagnosis not present

## 2015-04-28 DIAGNOSIS — Z720 Tobacco use: Secondary | ICD-10-CM | POA: Diagnosis not present

## 2015-04-28 DIAGNOSIS — Z1239 Encounter for other screening for malignant neoplasm of breast: Secondary | ICD-10-CM | POA: Diagnosis not present

## 2015-04-28 DIAGNOSIS — E785 Hyperlipidemia, unspecified: Secondary | ICD-10-CM | POA: Diagnosis not present

## 2015-04-28 DIAGNOSIS — J309 Allergic rhinitis, unspecified: Secondary | ICD-10-CM | POA: Diagnosis not present

## 2015-04-28 DIAGNOSIS — N6001 Solitary cyst of right breast: Secondary | ICD-10-CM | POA: Diagnosis not present

## 2015-04-28 DIAGNOSIS — E039 Hypothyroidism, unspecified: Secondary | ICD-10-CM

## 2015-04-28 MED ORDER — ATORVASTATIN CALCIUM 20 MG PO TABS: 20.0000 mg | ORAL_TABLET | Freq: Every day | ORAL | Status: DC

## 2015-04-28 MED ORDER — SUVOREXANT 10 MG PO TABS: 1.0000 | ORAL_TABLET | Freq: Every evening | ORAL | Status: DC | PRN

## 2015-04-28 MED ORDER — LEVOTHYROXINE SODIUM 175 MCG PO TABS: 175.0000 ug | ORAL_TABLET | Freq: Every day | ORAL | Status: DC

## 2015-04-28 MED ORDER — FLUTICASONE PROPIONATE 50 MCG/ACT NA SUSP: 1.0000 | Freq: Every day | NASAL | Status: DC

## 2015-04-28 NOTE — Patient Instructions (Signed)
Encounter Diagnoses  Name Primary?  . Breast cyst, right Yes  . Screening for breast cancer   . Insomnia   . Allergic rhinitis, unspecified allergic rhinitis type   . Hypothyroidism, unspecified hypothyroidism type   . Hyperlipidemia   . Smoker   . Paresthesia    Plan Return for fasting labs and physical I sent refills on thyroid, cholesterol and allergy medication Continue self breat exams and we have scheduled you mammograms STOP Tobacco!!! Begin night time reinforced wrist splint for carpal tunnel symptoms
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
## 2015-06-02 ENCOUNTER — Other Ambulatory Visit (HOSPITAL_COMMUNITY)
Admission: RE | Admit: 2015-06-02 | Discharge: 2015-06-02 | Disposition: A | Discharge status: Home / Self Care | Payer: 59 | Source: Ambulatory Visit | Attending: Medical | Admitting: Medical

## 2015-06-02 ENCOUNTER — Ambulatory Visit (INDEPENDENT_AMBULATORY_CARE_PROVIDER_SITE_OTHER): Payer: 59 | Admitting: Medical

## 2015-06-02 ENCOUNTER — Encounter: Payer: Self-pay | Admitting: Medical

## 2015-06-02 VITALS — BP 130/90 | HR 95 | Temp 97.7°F | Resp 16 | Ht 67.0 in | Wt 187.0 lb

## 2015-06-02 DIAGNOSIS — Z1239 Encounter for other screening for malignant neoplasm of breast: Secondary | ICD-10-CM

## 2015-06-02 DIAGNOSIS — E782 Mixed hyperlipidemia: Secondary | ICD-10-CM

## 2015-06-02 DIAGNOSIS — E038 Other specified hypothyroidism: Secondary | ICD-10-CM | POA: Diagnosis not present

## 2015-06-02 DIAGNOSIS — F17200 Nicotine dependence, unspecified, uncomplicated: Secondary | ICD-10-CM | POA: Insufficient documentation

## 2015-06-02 DIAGNOSIS — Z8639 Personal history of other endocrine, nutritional and metabolic disease: Secondary | ICD-10-CM | POA: Diagnosis not present

## 2015-06-02 DIAGNOSIS — J4520 Mild intermittent asthma, uncomplicated: Secondary | ICD-10-CM | POA: Diagnosis not present

## 2015-06-02 DIAGNOSIS — Z124 Encounter for screening for malignant neoplasm of cervix: Secondary | ICD-10-CM | POA: Diagnosis not present

## 2015-06-02 DIAGNOSIS — G4700 Insomnia, unspecified: Secondary | ICD-10-CM

## 2015-06-02 DIAGNOSIS — Z113 Encounter for screening for infections with a predominantly sexual mode of transmission: Secondary | ICD-10-CM

## 2015-06-02 DIAGNOSIS — Z803 Family history of malignant neoplasm of breast: Secondary | ICD-10-CM | POA: Diagnosis not present

## 2015-06-02 DIAGNOSIS — Z720 Tobacco use: Secondary | ICD-10-CM | POA: Diagnosis not present

## 2015-06-02 DIAGNOSIS — R030 Elevated blood-pressure reading, without diagnosis of hypertension: Secondary | ICD-10-CM

## 2015-06-02 DIAGNOSIS — J302 Other seasonal allergic rhinitis: Secondary | ICD-10-CM | POA: Diagnosis not present

## 2015-06-02 DIAGNOSIS — Z01419 Encounter for gynecological examination (general) (routine) without abnormal findings: Secondary | ICD-10-CM | POA: Diagnosis not present

## 2015-06-02 DIAGNOSIS — N63 Unspecified lump in unspecified breast: Secondary | ICD-10-CM

## 2015-06-02 DIAGNOSIS — N951 Menopausal and female climacteric states: Secondary | ICD-10-CM

## 2015-06-02 DIAGNOSIS — Z0000 Encounter for general adult medical examination without abnormal findings: Secondary | ICD-10-CM | POA: Diagnosis not present

## 2015-06-02 DIAGNOSIS — Z1211 Encounter for screening for malignant neoplasm of colon: Secondary | ICD-10-CM | POA: Diagnosis not present

## 2015-06-02 LAB — COMPREHENSIVE METABOLIC PANEL
ALK PHOS: 73 U/L (ref 39–117)
ALT: 21 U/L (ref 0–35)
AST: 17 U/L (ref 0–37)
Albumin: 3.9 g/dL (ref 3.5–5.2)
BUN: 9 mg/dL (ref 6–23)
CALCIUM: 9.3 mg/dL (ref 8.4–10.5)
CO2: 27 mEq/L (ref 19–32)
Chloride: 105 mEq/L (ref 96–112)
Creat: 0.85 mg/dL (ref 0.50–1.10)
Glucose, Bld: 86 mg/dL (ref 70–99)
Potassium: 3.8 mEq/L (ref 3.5–5.3)
Sodium: 139 mEq/L (ref 135–145)
Total Bilirubin: 0.4 mg/dL (ref 0.2–1.2)
Total Protein: 6.3 g/dL (ref 6.0–8.3)

## 2015-06-02 LAB — LIPID PANEL
CHOLESTEROL: 141 mg/dL (ref 0–200)
HDL: 39 mg/dL — ABNORMAL LOW (ref >=46)
LDL CALC: 82 mg/dL (ref 0–99)
Total CHOL/HDL Ratio: 3.6 Ratio
Triglycerides: 102 mg/dL (ref <150)
VLDL: 20 mg/dL (ref 0–40)

## 2015-06-02 LAB — POCT URINALYSIS DIPSTICK
Bilirubin, UA: NEGATIVE
Glucose, UA: NEGATIVE
Ketones, UA: NEGATIVE
Leukocytes, UA: NEGATIVE
NITRITE UA: NEGATIVE
RBC UA: NEGATIVE
Spec Grav, UA: >=1.03
Urobilinogen, UA: NEGATIVE
pH, UA: 6

## 2015-06-02 LAB — CBC
HEMATOCRIT: 36.5 % (ref 36.0–46.0)
Hemoglobin: 11.9 g/dL — ABNORMAL LOW (ref 12.0–15.0)
MCH: 29 pg (ref 26.0–34.0)
MCHC: 32.6 g/dL (ref 30.0–36.0)
MCV: 89 fL (ref 78.0–100.0)
MPV: 10.9 fL (ref 8.6–12.4)
PLATELETS: 189 10*3/uL (ref 150–400)
RBC: 4.1 MIL/uL (ref 3.87–5.11)
RDW: 15.4 % (ref 11.5–15.5)
WBC: 6.9 10*3/uL (ref 4.0–10.5)
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
## 2015-06-02 NOTE — Patient Instructions (Signed)
Recommendations  We will call with lab results  Exercise regularly  Eat healthy foods  STOP TOBACCO completely! See your eye doctor yearly for routine vision care. See your eye doctor yearly for routine vision care. Continue your current medications

## 2015-06-03 LAB — T4, FREE: Free T4: 1.4 ng/dL (ref 0.80–1.80)

## 2015-06-03 LAB — HIV ANTIBODY (ROUTINE TESTING W REFLEX): HIV: NONREACTIVE

## 2015-06-03 LAB — FSH/LH
FSH: 12.3 m[IU]/mL
LH: 50.4 m[IU]/mL

## 2015-06-03 LAB — RPR

## 2015-06-03 LAB — TSH: TSH: 0.089 u[IU]/mL — ABNORMAL LOW (ref 0.350–4.500)

## 2015-06-03 LAB — CYTOLOGY - PAP

## 2015-06-04 ENCOUNTER — Other Ambulatory Visit: Payer: Self-pay | Admitting: Medical

## 2015-06-04 ENCOUNTER — Other Ambulatory Visit: Payer: Self-pay | Admitting: Family Medicine

## 2015-06-04 ENCOUNTER — Telehealth: Payer: Self-pay | Admitting: Obstetrics and Gynecology

## 2015-06-04 DIAGNOSIS — Z3009 Encounter for other general counseling and advice on contraception: Secondary | ICD-10-CM

## 2015-06-04 DIAGNOSIS — E038 Other specified hypothyroidism: Secondary | ICD-10-CM

## 2015-06-04 MED ORDER — ATORVASTATIN CALCIUM 20 MG PO TABS
20.0000 mg | ORAL_TABLET | Freq: Every day | ORAL | Status: DC
Start: 2015-06-04 — End: 2016-07-09

## 2015-06-04 MED ORDER — SUVOREXANT 10 MG PO TABS: 1.0000 | ORAL_TABLET | Freq: Every evening | ORAL | Status: DC | PRN

## 2015-06-04 MED ORDER — LEVOTHYROXINE SODIUM 150 MCG PO TABS: 150.0000 ug | ORAL_TABLET | Freq: Every day | ORAL | Status: DC

## 2015-06-04 NOTE — Telephone Encounter (Signed)
Called and left a message for patient to call back to schedule a new patient doctor referral. °

## 2015-06-05 NOTE — Telephone Encounter (Signed)
Called and left a message for patient to call back to schedule a new patient doctor referral. °

## 2015-06-23 ENCOUNTER — Other Ambulatory Visit: Payer: Self-pay | Admitting: Medical

## 2015-07-11 ENCOUNTER — Telehealth: Payer: Self-pay | Admitting: Obstetrics and Gynecology

## 2015-07-14 ENCOUNTER — Ambulatory Visit: Payer: 59 | Admitting: Obstetrics and Gynecology

## 2015-07-15 ENCOUNTER — Telehealth: Payer: Self-pay | Admitting: Obstetrics and Gynecology

## 2015-07-15 NOTE — Telephone Encounter (Signed)
Called and left a message for patient to call back to schedule a new patient doctor referral. °

## 2015-07-17 NOTE — Telephone Encounter (Signed)
Called and left a message for patient to call back to schedule a new patient doctor referral. °

## 2015-07-19 ENCOUNTER — Other Ambulatory Visit: Payer: Self-pay | Admitting: Medical

## 2015-08-07 NOTE — Telephone Encounter (Signed)
Called and left a message for patient to call back to schedule a new patient doctor referral. °

## 2015-08-08 NOTE — Telephone Encounter (Signed)
Routing referral back to referring office due to patient not returning numerous calls to reschedule her cancelled appointment.

## 2015-09-05 ENCOUNTER — Encounter: Payer: Self-pay | Admitting: Medical

## 2015-09-05 ENCOUNTER — Ambulatory Visit (INDEPENDENT_AMBULATORY_CARE_PROVIDER_SITE_OTHER): Payer: 59 | Admitting: Medical

## 2015-09-05 VITALS — BP 148/98 | HR 78 | Temp 98.6°F | Resp 18 | Wt 185.4 lb

## 2015-09-05 DIAGNOSIS — M2141 Flat foot [pes planus] (acquired), right foot: Secondary | ICD-10-CM

## 2015-09-05 DIAGNOSIS — M722 Plantar fascial fibromatosis: Secondary | ICD-10-CM | POA: Diagnosis not present

## 2015-09-05 DIAGNOSIS — M79672 Pain in left foot: Secondary | ICD-10-CM

## 2015-09-05 DIAGNOSIS — M79671 Pain in right foot: Secondary | ICD-10-CM

## 2015-09-05 DIAGNOSIS — M2142 Flat foot [pes planus] (acquired), left foot: Secondary | ICD-10-CM

## 2015-09-05 DIAGNOSIS — E038 Other specified hypothyroidism: Secondary | ICD-10-CM | POA: Diagnosis not present

## 2015-09-05 LAB — T4, FREE: Free T4: 1.83 ng/dL — ABNORMAL HIGH (ref 0.80–1.80)

## 2015-09-05 LAB — TSH: TSH: 0.126 u[IU]/mL — ABNORMAL LOW (ref 0.350–4.500)

## 2015-09-05 NOTE — Progress Notes (Signed)
    Subjective Chief Complaint  Patient presents with  . feet/leg pain    review thyroid med   Here for bilat heel pain x 1 week.  Denies injury, trauma, swelling, fall, no paresthesia.  No prior similar.   Walks a lot for exercise.  Has been wearing some new shoes.   Feels sluggish, thinks thyroid dose may need to be adjusted.   Compliant with medication.  Past Medical History  Diagnosis Date  . FH: breast cancer     mother and grandmother  . Asthma   . Hypothyroidism   . Tobacco use disorder   . Palpitations 03/2012    cardiac consult, echocardiogram, holter monitor - Dr. Wynonia Lawman  . Allergy   . Mixed dyslipidemia 2013  . Routine gynecological examination 02/2012    pap negative and HPV negative  . H/O mammogram   . Wears dentures     upper  . History of Graves' disease   . Wears contact lenses   . GERD (gastroesophageal reflux disease)   . Wears dentures    ROS as in subjective   Objective: BP 148/98 mmHg  Pulse 78  Temp(Src) 98.6 F (37 C) (Oral)  Resp 18  Wt 185 lb 6.4 oz (84.097 kg)  Gen: wd, wn, nad Skin: unremarkable Neck: supple, non tender, no mass, no thyromegaly, no lymphadenopathy Heart rrr, normal s1, s2, no murmurs Lungs clear Pedal pulses norma No ext edema Tender along anterior volar heels bilat, but otherwise nontender, no deformity, normal foot and toe ROM Normal sensation and strength of bilat feet    Assessment: Encounter Diagnoses  Name Primary?  . Plantar fasciitis, bilateral Yes  . Heel pain, bilateral   . Pes planus of both feet   . Other specified hypothyroidism     Plan: Plantar fascitis, heel pain, pes planus - advised she use daily stretching and tennis ball massage to the feet, ice water bottle massage for comfort and pain.  Avoid going barefooted or wearing flat soled shoes or flops.   She has some custom arch supports that were made for her but she hasn't been using . She needs to start using those.   advised daily NSAID  for the next few weeks.   F/u in 3-4 wk.  Hypothyroidism - labs today

## 2015-10-29 LAB — HM MAMMOGRAPHY

## 2015-10-30 ENCOUNTER — Ambulatory Visit (INDEPENDENT_AMBULATORY_CARE_PROVIDER_SITE_OTHER): Payer: 59 | Admitting: Certified Nurse Midwife

## 2015-10-30 ENCOUNTER — Encounter: Payer: Self-pay | Admitting: Certified Nurse Midwife

## 2015-10-30 VITALS — BP 120/72 | HR 70 | Ht 66.5 in | Wt 185.0 lb

## 2015-10-30 DIAGNOSIS — Z0000 Encounter for general adult medical examination without abnormal findings: Secondary | ICD-10-CM | POA: Diagnosis not present

## 2015-10-30 DIAGNOSIS — N9489 Other specified conditions associated with female genital organs and menstrual cycle: Secondary | ICD-10-CM

## 2015-10-30 DIAGNOSIS — Z01419 Encounter for gynecological examination (general) (routine) without abnormal findings: Secondary | ICD-10-CM

## 2015-10-30 DIAGNOSIS — Z124 Encounter for screening for malignant neoplasm of cervix: Secondary | ICD-10-CM

## 2015-10-30 DIAGNOSIS — N898 Other specified noninflammatory disorders of vagina: Secondary | ICD-10-CM

## 2015-10-30 LAB — POCT URINALYSIS DIPSTICK
Bilirubin, UA: NEGATIVE
Glucose, UA: NEGATIVE
Ketones, UA: NEGATIVE
NITRITE UA: NEGATIVE
PROTEIN UA: NEGATIVE
RBC UA: NEGATIVE
UROBILINOGEN UA: NEGATIVE
pH, UA: 5

## 2015-10-30 LAB — HEMOGLOBIN, FINGERSTICK: HEMOGLOBIN, FINGERSTICK: 12.6 g/dL (ref 12.0–16.0)

## 2015-10-30 NOTE — Patient Instructions (Signed)
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
## 2015-10-31 ENCOUNTER — Other Ambulatory Visit: Payer: Self-pay | Admitting: Certified Nurse Midwife

## 2015-10-31 ENCOUNTER — Telehealth: Payer: Self-pay

## 2015-10-31 DIAGNOSIS — B9689 Other specified bacterial agents as the cause of diseases classified elsewhere: Secondary | ICD-10-CM

## 2015-10-31 DIAGNOSIS — N760 Acute vaginitis: Principal | ICD-10-CM

## 2015-10-31 LAB — WET PREP BY MOLECULAR PROBE
Candida species: NEGATIVE
GARDNERELLA VAGINALIS: POSITIVE — AB
Trichomonas vaginosis: NEGATIVE

## 2015-10-31 LAB — VITAMIN D 25 HYDROXY (VIT D DEFICIENCY, FRACTURES): VIT D 25 HYDROXY: 21 ng/mL — AB (ref 30–100)

## 2015-10-31 MED ORDER — HYLAFEM VA SUPP: 1.0000 | Freq: Every day | VAGINAL | Status: DC

## 2015-10-31 NOTE — Telephone Encounter (Signed)
lmtcb

## 2015-10-31 NOTE — Telephone Encounter (Signed)
-----   Message from Regina Eck, CNM sent at 10/31/2015  7:20 AM EDT ----- Notify patient that her Vitamin D is low, protocol Affirm showed positive for BV order in for Hylafem, give instructions Yeast and trichomonas were negative

## 2015-10-31 NOTE — Telephone Encounter (Signed)
Patient notified of results. See lab 

## 2015-11-03 ENCOUNTER — Telehealth: Payer: Self-pay | Admitting: Medical

## 2015-11-03 NOTE — Telephone Encounter (Signed)
Mammogram results show probably benign cysts, and they recommended repeat mammogram and ultrasound in 45mo.

## 2015-11-03 NOTE — Telephone Encounter (Signed)
LMTCB

## 2015-11-03 NOTE — Progress Notes (Signed)
Encounter reviewed [name], MD   

## 2015-11-04 ENCOUNTER — Encounter: Payer: Self-pay | Admitting: Medical

## 2015-11-04 LAB — IPS PAP TEST WITH HPV

## 2015-11-04 NOTE — Telephone Encounter (Signed)
Pt called back for Megan Bullock. Gave her the info on her mammogram. Pt was already aware of the results and states that she has already scheduled follow up appt in 5 mos to have repeat mammo & ultrasound

## 2015-11-06 NOTE — Telephone Encounter (Signed)
Ok thank you Tammy

## 2015-11-10 ENCOUNTER — Other Ambulatory Visit: Payer: Self-pay | Admitting: Medical

## 2015-11-10 NOTE — Telephone Encounter (Signed)
Is this ok to refill? Looks like her last appt was 09/05/15 but she was supposed to follow up in 3/4 weeks?

## 2015-11-12 ENCOUNTER — Encounter: Payer: Self-pay | Admitting: Medical

## 2015-11-19 ENCOUNTER — Telehealth: Payer: Self-pay | Admitting: Certified Nurse Midwife

## 2015-11-19 NOTE — Telephone Encounter (Signed)
Patient was seen 10/30/15 and was prescribed an antibiotic. Patient now has a yeast infection from the antibiotic. Patient is asking to talk with a nurse,

## 2015-11-19 NOTE — Telephone Encounter (Signed)
Call to patient. She states she used 3 days of Hylafem and symptoms of vaginal odor and vaginal discharge were improved. She now is having redness of vagina, vaginal itching and skin irritation. Patient declines to refill Hylafem. Declines office visit for now. Using vagisil with no relief. Advised patient to DC vagisil and she agrees. Advise given:  Monistat 3 (OTC) and Hydrocortisone ointment for external irritation. Advised to keep area clean and dry. Change Damp clothes as soon as possible. White cotton underwear is best. Can also try [place] for relief of external irritation. Call back for office visit if not improved patient agreeable. Will call back as needed or if not improved.  Routing to provider for final review. Patient agreeable to disposition. Will close encounter.

## 2015-11-19 NOTE — Telephone Encounter (Signed)
Left message to call Lori Popowski at [phone]. 

## 2016-01-20 ENCOUNTER — Other Ambulatory Visit: Payer: Self-pay | Admitting: Medical

## 2016-01-20 ENCOUNTER — Ambulatory Visit (INDEPENDENT_AMBULATORY_CARE_PROVIDER_SITE_OTHER): Payer: 59 | Admitting: Medical

## 2016-01-20 ENCOUNTER — Encounter: Payer: Self-pay | Admitting: Medical

## 2016-01-20 VITALS — BP 128/92 | HR 96 | Wt 190.0 lb

## 2016-01-20 DIAGNOSIS — E038 Other specified hypothyroidism: Secondary | ICD-10-CM | POA: Diagnosis not present

## 2016-01-20 DIAGNOSIS — F411 Generalized anxiety disorder: Secondary | ICD-10-CM | POA: Diagnosis not present

## 2016-01-20 DIAGNOSIS — Z720 Tobacco use: Secondary | ICD-10-CM | POA: Diagnosis not present

## 2016-01-20 DIAGNOSIS — R51 Headache: Secondary | ICD-10-CM | POA: Diagnosis not present

## 2016-01-20 DIAGNOSIS — F17200 Nicotine dependence, unspecified, uncomplicated: Secondary | ICD-10-CM

## 2016-01-20 DIAGNOSIS — I10 Essential (primary) hypertension: Secondary | ICD-10-CM | POA: Diagnosis not present

## 2016-01-20 DIAGNOSIS — R519 Headache, unspecified: Secondary | ICD-10-CM

## 2016-01-20 LAB — TSH: TSH: 0.142 u[IU]/mL — AB (ref 0.350–4.500)

## 2016-01-20 LAB — T4, FREE: FREE T4: 1.52 ng/dL (ref 0.80–1.80)

## 2016-01-20 MED ORDER — METOPROLOL TARTRATE 25 MG PO TABS
25.0000 mg | ORAL_TABLET | Freq: Two times a day (BID) | ORAL | Status: DC
Start: 2016-01-20 — End: 2016-03-02

## 2016-01-20 NOTE — Progress Notes (Signed)
Subjective: Chief Complaint  Patient presents with  . bp    husband passed away, she is having a tough time. 139/90. her husband passed on 01/09/16.    Here for elevated BPs.  She notes hx/o chronic headaches, gets migraines, and lately they have been frequent.  She has been under some stress.  Has anxiety problems in general, but few weeks ago her husband (estranged) of 23 years was found dead in the street, reportedly from drug overdose.  He has hx/o drug addiction for years.  She was out of the country on a cruise when she found out.   She has a boyfriend.  She notes recently BPs have been elevated.   She has hx/o borderline hypertension over the last few years.  She does smoke some, mainly when stressed, black and mild.  She exercises with walking.  No paresthesias, no nausea, no chest pains, no SOB, no palpations.  No other aggravating or relieving factors. No other complaint.  Past Medical History  Diagnosis Date  . FH: breast cancer     mother and grandmother  . Asthma   . Hypothyroidism   . Tobacco use disorder   . Palpitations 03/2012    cardiac consult, echocardiogram, holter monitor - Dr. Wynonia Lawman  . Allergy   . Mixed dyslipidemia 2013  . Routine gynecological examination 02/2012    pap negative and HPV negative  . H/O mammogram   . Wears dentures     upper  . History of Graves' disease   . Wears contact lenses   . GERD (gastroesophageal reflux disease)    ROS as in subjective   Objective: BP 128/92 mmHg  Pulse 96  Wt 190 lb (86.183 kg)  SpO2 98%  LMP 11/27/2015  General appearance: alert, no distress, WD/WN,  Neck: supple, no lymphadenopathy, no thyromegaly, no masses, no bruits Heart: RRR, normal S1, S2, no murmurs Lungs: CTA bilaterally, no wheezes, rhonchi, or rales Abdomen: +bs, soft, non tender, non distended, no masses, no hepatomegaly, no splenomegaly Pulses: 2+ symmetric, upper and lower extremities, normal cap refill Neuro: nonfocla  exam   Assessment: Encounter Diagnoses  Name Primary?  . Essential hypertension Yes  . Other specified hypothyroidism   . Smoker   . Chronic nonintractable headache, unspecified headache type   . Anxiety state     Plan: discussed concerns symptoms, diagnosis of HTN, risks of uncontrolled hypertension.   discussed medication options.  Given the headaches, anxiety and BP, beta blocker seem like a good choice.  Begin Metoprolol which she tried briefly few years ago.   Discussed risks/benefits .  F/u in 55mo.

## 2016-01-22 LAB — PROLACTIN: Prolactin: 4.7 ng/mL

## 2016-01-23 ENCOUNTER — Other Ambulatory Visit: Payer: Self-pay | Admitting: Medical

## 2016-01-26 ENCOUNTER — Other Ambulatory Visit: Payer: Self-pay | Admitting: Medical

## 2016-01-26 MED ORDER — LEVOTHYROXINE SODIUM 175 MCG PO TABS: ORAL_TABLET | ORAL | Status: DC

## 2016-03-02 ENCOUNTER — Encounter: Payer: Self-pay | Admitting: Medical

## 2016-03-02 ENCOUNTER — Ambulatory Visit (INDEPENDENT_AMBULATORY_CARE_PROVIDER_SITE_OTHER): Payer: 59 | Admitting: Medical

## 2016-03-02 VITALS — BP 128/88 | HR 73 | Wt 188.0 lb

## 2016-03-02 DIAGNOSIS — I10 Essential (primary) hypertension: Secondary | ICD-10-CM

## 2016-03-02 DIAGNOSIS — F17200 Nicotine dependence, unspecified, uncomplicated: Secondary | ICD-10-CM

## 2016-03-02 DIAGNOSIS — E038 Other specified hypothyroidism: Secondary | ICD-10-CM

## 2016-03-02 DIAGNOSIS — G4700 Insomnia, unspecified: Secondary | ICD-10-CM

## 2016-03-02 DIAGNOSIS — R51 Headache: Secondary | ICD-10-CM | POA: Diagnosis not present

## 2016-03-02 DIAGNOSIS — F411 Generalized anxiety disorder: Secondary | ICD-10-CM

## 2016-03-02 DIAGNOSIS — Z720 Tobacco use: Secondary | ICD-10-CM | POA: Diagnosis not present

## 2016-03-02 DIAGNOSIS — R519 Headache, unspecified: Secondary | ICD-10-CM

## 2016-03-02 MED ORDER — METOPROLOL TARTRATE 25 MG PO TABS: 25.0000 mg | ORAL_TABLET | Freq: Two times a day (BID) | ORAL | Status: DC

## 2016-03-02 NOTE — Progress Notes (Signed)
Subjective: Chief Complaint  Patient presents with  . Follow-up    bp. checked 3 weeks ago and it was 136/80. no problems or concerns   Here for recheck.   Last visit we added Metoprolol for headaches, anxiety, and migraines .  Overall, headaches have mostly resolved, doing better handling stress, and has checked BPs some and they have been fine.  No new c/o.   Last visit she c/o chronic headaches, gets migraines, and lately they have been frequent.  She has been under some stress.  Has anxiety problems in general, but few weeks ago her husband (estranged) of 23 years was found dead in the street, reportedly from drug overdose.  He has hx/o drug addiction for years.  She was out of the country on a cruise when she found out.   She has a boyfriend.  She noted recently BPs had been elevated.   She has hx/o borderline hypertension over the last few years.  She does smoke some, mainly when stressed, black and mild.  Since last visit been using Nicotine lozenges and has cut back on tobacco.  She exercises with walking.  No paresthesias, no nausea, no chest pains, no SOB, no palpations.  No other aggravating or relieving factors. No other complaint.  Past Medical History  Diagnosis Date  . FH: breast cancer     mother and grandmother  . Asthma   . Hypothyroidism   . Tobacco use disorder   . Palpitations 03/2012    cardiac consult, echocardiogram, holter monitor - Dr. Wynonia Lawman  . Allergy   . Mixed dyslipidemia 2013  . Routine gynecological examination 02/2012    pap negative and HPV negative  . H/O mammogram   . Wears dentures     upper  . History of Graves' disease   . Wears contact lenses   . GERD (gastroesophageal reflux disease)    ROS as in subjective   Objective: BP 128/88 mmHg  Pulse 73  Wt 188 lb (85.276 kg)  LMP 02/26/2016  General appearance: alert, no distress, WD/WN,  Neck: supple, no lymphadenopathy, no thyromegaly, no masses, no bruits Heart: RRR, normal S1, S2, no  murmurs Lungs: CTA bilaterally, no wheezes, rhonchi, or rales Pulses: 2+ symmetric, upper and lower extremities, normal cap refill   Assessment: Encounter Diagnoses  Name Primary?  . Essential hypertension Yes  . Other specified hypothyroidism   . Chronic nonintractable headache, unspecified headache type   . Anxiety state   . Smoker   . Insomnia     Plan: Glad to hear improvements in stress, BP looks good, and headaches improved.   C/t Metoprolol 25mg  BID, c/t nicotine lozenges and efforts to stop tobacco.   Plan to f/u June fasting.   Jericka was seen today for follow-up.  Diagnoses and all orders for this visit:  Essential hypertension  Other specified hypothyroidism  Chronic nonintractable headache, unspecified headache type  Anxiety state  Smoker  Insomnia  Other orders -     metoprolol tartrate (LOPRESSOR) 25 MG tablet; Take 1 tablet (25 mg total) by mouth 2 (two) times daily. 1 tablet po BID for blood pressure

## 2016-03-25 ENCOUNTER — Other Ambulatory Visit: Payer: Self-pay | Admitting: Medical

## 2016-04-05 LAB — HM MAMMOGRAPHY

## 2016-04-13 ENCOUNTER — Telehealth: Payer: Self-pay | Admitting: Medical

## 2016-04-13 NOTE — Telephone Encounter (Signed)
Mammogram normal/no worrisome findings.  There was a cyst found, but it is benign.  Repeat mammogram screen in 1  year.

## 2016-04-14 ENCOUNTER — Encounter: Payer: Self-pay | Admitting: *Deleted

## 2016-04-14 NOTE — Telephone Encounter (Signed)
Pt is aware.  

## 2016-04-22 ENCOUNTER — Encounter: Payer: Self-pay | Admitting: Medical

## 2016-05-20 ENCOUNTER — Encounter: Payer: Self-pay | Admitting: Medical

## 2016-05-20 ENCOUNTER — Ambulatory Visit (INDEPENDENT_AMBULATORY_CARE_PROVIDER_SITE_OTHER): Payer: 59 | Admitting: Medical

## 2016-05-20 VITALS — BP 130/92 | HR 72 | Wt 191.0 lb

## 2016-05-20 DIAGNOSIS — F17200 Nicotine dependence, unspecified, uncomplicated: Secondary | ICD-10-CM | POA: Diagnosis not present

## 2016-05-20 DIAGNOSIS — M545 Low back pain, unspecified: Secondary | ICD-10-CM

## 2016-05-20 DIAGNOSIS — L0390 Cellulitis, unspecified: Secondary | ICD-10-CM

## 2016-05-20 DIAGNOSIS — H578 Other specified disorders of eye and adnexa: Secondary | ICD-10-CM | POA: Diagnosis not present

## 2016-05-20 DIAGNOSIS — H5789 Other specified disorders of eye and adnexa: Secondary | ICD-10-CM

## 2016-05-20 LAB — POCT URINALYSIS DIPSTICK
BILIRUBIN UA: NEGATIVE
Blood, UA: NEGATIVE
GLUCOSE UA: NEGATIVE
KETONES UA: NEGATIVE
LEUKOCYTES UA: NEGATIVE
Nitrite, UA: NEGATIVE
PH UA: 6
Protein, UA: NEGATIVE
Spec Grav, UA: 1.025
Urobilinogen, UA: NEGATIVE

## 2016-05-20 MED ORDER — TIZANIDINE HCL 4 MG PO TABS: 4.0000 mg | ORAL_TABLET | Freq: Every day | ORAL | Status: DC

## 2016-05-20 MED ORDER — NICOTINE 21 MG/24HR TD PT24: 21.0000 mg | MEDICATED_PATCH | Freq: Every day | TRANSDERMAL | Status: DC

## 2016-05-20 MED ORDER — SULFAMETHOXAZOLE-TRIMETHOPRIM 800-160 MG PO TABS: 1.0000 | ORAL_TABLET | Freq: Two times a day (BID) | ORAL | Status: DC

## 2016-05-20 NOTE — Addendum Note (Signed)
Addended by: Billie Lade on: 05/20/2016 09:45 AM   Modules accepted: Orders

## 2016-05-20 NOTE — Progress Notes (Signed)
Subjective: Chief Complaint  Patient presents with  . Back Pain    on rt lower side of back. states it comes and goes at random   Here for intermittent right low back/flank pain, intermittent, at random.   It can occur just sitting doing nothing.   Is a sharp pain.   Lasts for seconds at times.   No pain down leg, no numbness, tingling or weakness.  No current urinary symptoms.  Has lump x right inguinal region for about a month.  Thinks it may be a hair bump not resolving.   Periods regular, not heavy or irregular currently.  No vaginal discharge.  No bowel issues.  No fever.  Has some hot flashes and sweats but attributes to menopausal symptoms.    Wants to try patch to help stop tobacco.  No prior use.   Smoking black and mild, 2-3 daily  Right eye has been red a few days, but used some eye drops I gave her in the past, and now its clear today.  Past Medical History  Diagnosis Date  . FH: breast cancer     mother and grandmother  . Asthma   . Hypothyroidism   . Tobacco use disorder   . Palpitations 03/2012    cardiac consult, echocardiogram, holter monitor - Dr. Wynonia Lawman  . Allergy   . Mixed dyslipidemia 2013  . Routine gynecological examination 02/2012    pap negative and HPV negative  . H/O mammogram   . Wears dentures     upper  . History of Graves' disease   . Wears contact lenses   . GERD (gastroesophageal reflux disease)    Family History  Problem Relation Age of Onset  . Hypertension Mother   . Aneurysm Mother   . Cancer Mother     breast  . Stroke Mother     d/t aneurysm  . Diabetes Father   . Cancer Maternal Grandmother     breast  . Heart disease Maternal Aunt   . Cancer Maternal Aunt     breast  . Aneurysm Maternal Aunt     x 3  . Hypertension Brother   . Thyroid disease Brother    ROS as in subjective   Objective: BP 130/92 mmHg  Pulse 72  Wt 191 lb (86.637 kg)  LMP 04/18/2016  Gen: wd, wn, nad Skin: right mons pubis with 1.5 cm tender,  indurated, erythematous lesion.  No fluctuance.  Exam chaperoned by nurse.   Eyes normal appearing, no erythema, no PERRLA, EOMi Mild tenderness right lumbar paraspinal area, but no deformity, normal ROM, no pain with ROM, no scoliosis Legs and hips non tender, normal ROM, no deformity LE normal strength, sensation, DTRs, and -SLR Ext: no edema Pulses normal LE    Assessment: Encounter Diagnoses  Name Primary?  . Right-sided low back pain without sciatica Yes  . Redness of right eye   . Cellulitis, unspecified cellulitis site, unspecified extremity site, unspecified laterality   . Tobacco use disorder      Plan: Right sided intermittent low back pain - possible spasm vs arthritis vs other.   Discussed several possibilities.  UA normal.   For the next week try heat pad, more stretching than usual, no heavy lifting, tizanidine QHS, and if pain persists beyond a week consider updated pelvic exam, lumbar xray  Eye redness - resolved   Lump in groin - localized cellulitis.  Begin bactrim, warm compresses, and if not improving or worse within 3  days, call or return. recheck 1 wk.   Tobacco use - advised counseling, begin patches.  Recheck 64mo

## 2016-06-02 ENCOUNTER — Ambulatory Visit: Payer: 59 | Admitting: Medical

## 2016-07-09 ENCOUNTER — Other Ambulatory Visit: Payer: Self-pay | Admitting: Medical

## 2016-08-14 ENCOUNTER — Other Ambulatory Visit: Payer: Self-pay | Admitting: Medical

## 2016-11-02 ENCOUNTER — Encounter: Payer: Self-pay | Admitting: Certified Nurse Midwife

## 2016-11-02 ENCOUNTER — Ambulatory Visit (INDEPENDENT_AMBULATORY_CARE_PROVIDER_SITE_OTHER): Payer: 59 | Admitting: Certified Nurse Midwife

## 2016-11-02 VITALS — BP 120/70 | HR 84 | Resp 14 | Ht 66.5 in | Wt 193.0 lb

## 2016-11-02 DIAGNOSIS — Z01419 Encounter for gynecological examination (general) (routine) without abnormal findings: Secondary | ICD-10-CM

## 2016-11-02 DIAGNOSIS — F17200 Nicotine dependence, unspecified, uncomplicated: Secondary | ICD-10-CM
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
## 2016-11-02 NOTE — Patient Instructions (Signed)

## 2016-11-03 NOTE — Progress Notes (Signed)
Encounter reviewed Valla Pacey, MD   

## 2016-12-10 ENCOUNTER — Other Ambulatory Visit: Payer: Self-pay | Admitting: Medical

## 2016-12-10 NOTE — Telephone Encounter (Signed)
Called and l/m for patient to call us back. She is requesting a refill on her meds  (liptor) she needs to make an appt. First. L/m on her voicemail about this.

## 2017-01-15 ENCOUNTER — Other Ambulatory Visit: Payer: Self-pay | Admitting: Medical

## 2017-01-17 NOTE — Telephone Encounter (Signed)
Is this okay to refill? 

## 2017-01-18 NOTE — Telephone Encounter (Signed)
Called l/m for making her an appt for physical.

## 2017-01-18 NOTE — Telephone Encounter (Signed)
Approve and schedule for CPX and fasting labs

## 2017-01-19 NOTE — Telephone Encounter (Signed)
Called l/m for her to call us back about appt.

## 2017-03-06 ENCOUNTER — Other Ambulatory Visit: Payer: Self-pay | Admitting: Medical

## 2017-04-08 ENCOUNTER — Encounter: Payer: 59 | Admitting: Medical

## 2017-04-11 ENCOUNTER — Other Ambulatory Visit: Payer: Self-pay | Admitting: Medical

## 2017-04-29 ENCOUNTER — Other Ambulatory Visit: Payer: Self-pay | Admitting: Medical

## 2017-05-09 ENCOUNTER — Encounter: Payer: 59 | Admitting: Medical

## 2017-05-23 ENCOUNTER — Encounter (HOSPITAL_COMMUNITY): Payer: Self-pay | Admitting: Nurse Practitioner

## 2017-05-23 ENCOUNTER — Emergency Department (HOSPITAL_COMMUNITY): Payer: 59

## 2017-05-23 ENCOUNTER — Emergency Department (HOSPITAL_COMMUNITY)
Admission: EM | Admit: 2017-05-23 | Discharge: 2017-05-23 | Disposition: A | Discharge status: Home / Self Care | Payer: 59 | Attending: Emergency Medicine | Admitting: Emergency Medicine

## 2017-05-23 DIAGNOSIS — J4521 Mild intermittent asthma with (acute) exacerbation: Secondary | ICD-10-CM

## 2017-05-23 DIAGNOSIS — E039 Hypothyroidism, unspecified: Secondary | ICD-10-CM | POA: Insufficient documentation

## 2017-05-23 DIAGNOSIS — F17210 Nicotine dependence, cigarettes, uncomplicated: Secondary | ICD-10-CM | POA: Diagnosis not present

## 2017-05-23 DIAGNOSIS — J45901 Unspecified asthma with (acute) exacerbation: Secondary | ICD-10-CM | POA: Diagnosis not present

## 2017-05-23 DIAGNOSIS — Z79899 Other long term (current) drug therapy: Secondary | ICD-10-CM | POA: Diagnosis not present

## 2017-05-23 DIAGNOSIS — R0602 Shortness of breath: Secondary | ICD-10-CM | POA: Diagnosis present

## 2017-05-23 MED ORDER — PREDNISONE 20 MG PO TABS
60.0000 mg | ORAL_TABLET | Freq: Once | ORAL | Status: AC
  Administered 2017-05-23: 60 mg via ORAL
  Filled 2017-05-23: qty 3

## 2017-05-23 MED ORDER — ALBUTEROL SULFATE (2.5 MG/3ML) 0.083% IN NEBU
INHALATION_SOLUTION | RESPIRATORY_TRACT | Status: AC
  Filled 2017-05-23: qty 3

## 2017-05-23 MED ORDER — ALBUTEROL SULFATE (2.5 MG/3ML) 0.083% IN NEBU
5.0000 mg | INHALATION_SOLUTION | Freq: Once | RESPIRATORY_TRACT | Status: AC
  Administered 2017-05-23: 5 mg via RESPIRATORY_TRACT
  Filled 2017-05-23: qty 6

## 2017-05-23 MED ORDER — ALBUTEROL SULFATE HFA 108 (90 BASE) MCG/ACT IN AERS: 1.0000 | INHALATION_SPRAY | Freq: Four times a day (QID) | RESPIRATORY_TRACT | 0 refills | Status: DC | PRN

## 2017-05-23 MED ORDER — PREDNISONE 20 MG PO TABS: ORAL_TABLET | ORAL | 0 refills | Status: DC

## 2017-05-23 NOTE — ED Triage Notes (Signed)
Pt is c/o an asthma attack that she states is giving her trouble in breathing.
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
## 2017-06-22 LAB — HM MAMMOGRAPHY

## 2017-06-24 ENCOUNTER — Telehealth: Payer: Self-pay | Admitting: Medical

## 2017-06-24 NOTE — Telephone Encounter (Signed)
LM to CB to schedule physical. Also left message mammo was normal. Megan Bullock

## 2017-06-24 NOTE — Telephone Encounter (Signed)
I am happy to report that her mammogram was normal, no worrisome findings.  Also, get her in for yearly physical

## 2017-06-28 ENCOUNTER — Encounter: Payer: Self-pay | Admitting: Medical

## 2017-11-03 ENCOUNTER — Ambulatory Visit (INDEPENDENT_AMBULATORY_CARE_PROVIDER_SITE_OTHER): Payer: 59 | Admitting: Certified Nurse Midwife

## 2017-11-03 ENCOUNTER — Encounter: Payer: Self-pay | Admitting: Certified Nurse Midwife

## 2017-11-03 VITALS — BP 110/80 | HR 70 | Resp 16 | Ht 66.5 in | Wt 180.0 lb

## 2017-11-03 DIAGNOSIS — Z01419 Encounter for gynecological examination (general) (routine) without abnormal findings: Secondary | ICD-10-CM | POA: Diagnosis not present

## 2017-11-03 DIAGNOSIS — Z0000 Encounter for general adult medical examination without abnormal findings: Secondary | ICD-10-CM

## 2017-11-03 DIAGNOSIS — Z8639 Personal history of other endocrine, nutritional and metabolic disease: Secondary | ICD-10-CM

## 2017-11-03 DIAGNOSIS — N951 Menopausal and female climacteric states: Secondary | ICD-10-CM | POA: Diagnosis not present

## 2017-11-03 DIAGNOSIS — N912 Amenorrhea, unspecified: Secondary | ICD-10-CM

## 2017-11-03 DIAGNOSIS — F17210 Nicotine dependence, cigarettes, uncomplicated: Secondary | ICD-10-CM

## 2017-11-03 LAB — POCT URINE PREGNANCY: PREG TEST UR: NEGATIVE

## 2017-11-03 NOTE — Patient Instructions (Signed)
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
## 2017-11-04 LAB — COMPREHENSIVE METABOLIC PANEL
A/G RATIO: 2 (ref 1.2–2.2)
ALK PHOS: 94 IU/L (ref 39–117)
ALT: 14 IU/L (ref 0–32)
AST: 16 IU/L (ref 0–40)
Albumin: 4.4 g/dL (ref 3.5–5.5)
BUN / CREAT RATIO: 10 (ref 9–23)
BUN: 11 mg/dL (ref 6–24)
Bilirubin Total: 0.3 mg/dL (ref 0.0–1.2)
CHLORIDE: 106 mmol/L (ref 96–106)
CO2: 22 mmol/L (ref 20–29)
Calcium: 9.7 mg/dL (ref 8.7–10.2)
Creatinine, Ser: 1.14 mg/dL — ABNORMAL HIGH (ref 0.57–1.00)
GFR calc Af Amer: 64 mL/min/{1.73_m2} (ref >59)
GFR calc non Af Amer: 56 mL/min/{1.73_m2} — ABNORMAL LOW (ref >59)
GLUCOSE: 93 mg/dL (ref 65–99)
Globulin, Total: 2.2 g/dL (ref 1.5–4.5)
POTASSIUM: 4.2 mmol/L (ref 3.5–5.2)
Sodium: 145 mmol/L — ABNORMAL HIGH (ref 134–144)
Total Protein: 6.6 g/dL (ref 6.0–8.5)

## 2017-11-04 LAB — CBC
HEMATOCRIT: 41 % (ref 34.0–46.6)
HEMOGLOBIN: 13.7 g/dL (ref 11.1–15.9)
MCH: 29.8 pg (ref 26.6–33.0)
MCHC: 33.4 g/dL (ref 31.5–35.7)
MCV: 89 fL (ref 79–97)
Platelets: 204 10*3/uL (ref 150–379)
RBC: 4.6 x10E6/uL (ref 3.77–5.28)
RDW: 14.3 % (ref 12.3–15.4)
WBC: 6.7 10*3/uL (ref 3.4–10.8)

## 2017-11-04 LAB — LIPID PANEL
Chol/HDL Ratio: 4.4 ratio (ref 0.0–4.4)
Cholesterol, Total: 189 mg/dL (ref 100–199)
HDL: 43 mg/dL (ref >39)
LDL CALC: 118 mg/dL — AB (ref 0–99)
Triglycerides: 142 mg/dL (ref 0–149)
VLDL CHOLESTEROL CAL: 28 mg/dL (ref 5–40)

## 2017-11-04 LAB — VITAMIN D 25 HYDROXY (VIT D DEFICIENCY, FRACTURES): VIT D 25 HYDROXY: 30.9 ng/mL (ref 30.0–100.0)

## 2017-11-04 LAB — PROLACTIN: PROLACTIN: 6.5 ng/mL (ref 4.8–23.3)

## 2017-11-04 LAB — FOLLICLE STIMULATING HORMONE: FSH: 20.9 m[IU]/mL

## 2017-11-04 LAB — TSH: TSH: 0.006 u[IU]/mL — ABNORMAL LOW (ref 0.450–4.500)

## 2017-11-10 ENCOUNTER — Telehealth: Payer: Self-pay

## 2017-11-10 NOTE — Telephone Encounter (Signed)
Left message to call [place] at [phone].  Notes recorded by Regina Eck, CNM on 11/10/2017 at 8:24 AM EST Notify patient that her TSH was low, but T4 normal which does no indicate issues with Graves disease at present. She needs to follow up with Chana Bode who has been managing her Thyroid medication to see if needs any adjustment with medication needed.Marland Kitchen She will need copy of labs. Vitamin D is borderline low at 30.9 would recommend due to age OTC 1000 IU D3 daily, this helps with fatigue and supports bone calcium absorption Lipid panel essentially norma but LDL harmful cholesterol is elevated at 118. Cholesterol 189 and HDL 43 normal is >39, work on good fiber , fresh vegetables and lean meat in diet to improve profile, along with exercise Liver and glucose and kidney profile are normal but has elevation of Creatinine and slight sodium elevation. Can occur with dehydration or kidney issues . Needs repeat in 2 weeks order placed, hydrate well prior to labs. CBC is normal, no anemia FSH does not show menopause at this point, Prolactin normal Patient needs Rx Provera 10 mg daily for 10 days and needs to report bleeding or no bleeding for up 2 weeks after last dose of medication. Has she consistent condoms in past month if so can see Rx if not will need serum HCG in 2 weeks due to negative UPT at appointment before giving Provera, if not sexually active can send Rx

## 2017-11-10 NOTE — Telephone Encounter (Signed)
Spoke with patient. Advised of all results as seen below from [place]. Patient verbalizes understanding. Will contact Dr.Tysinger to discuss thyroid medication. Will start OTC Vitamin D. 2 week lab follow up for HCG level and CMP scheduled for 11/24/2017 at 8:30 am. Aware she will need to abstain from intercourse or use condoms consistently. Patient is agreeable.   Routing to provider for final review. Patient agreeable to disposition. Will close encounter.

## 2017-11-16 LAB — T4: T4, Total: 11.6 ug/dL (ref 4.5–12.0)

## 2017-11-16 LAB — SPECIMEN STATUS REPORT

## 2017-11-24 ENCOUNTER — Other Ambulatory Visit (INDEPENDENT_AMBULATORY_CARE_PROVIDER_SITE_OTHER): Payer: 59

## 2017-11-24 ENCOUNTER — Telehealth: Payer: Self-pay | Admitting: Obstetrics & Gynecology

## 2017-11-24 DIAGNOSIS — R899 Unspecified abnormal finding in specimens from other organs, systems and tissues: Secondary | ICD-10-CM

## 2017-11-24 DIAGNOSIS — N912 Amenorrhea, unspecified: Secondary | ICD-10-CM

## 2017-11-24 NOTE — Telephone Encounter (Signed)
Return call to patient at 0915. States she has boil or cyst on external laial area that is painful.  Patient was here this am at 830 for lab appointment and was offered appointment at 915 and 11. Again offered these appointments to patient which she declines.  Advised that she needs to be seen for evaluation before treatment can be provided.  Remaining option is now 11am or tomorrow at 1030 and 1245.   Patient agreeable to 1030 tomorrow. Advised to call back if desires to come today at 58 or may go to urgent care if worsens.   Routing to provider for final review. Patient agreeable to disposition. Will close encounter.

## 2017-11-24 NOTE — Telephone Encounter (Signed)
Patient has a boil that she would like checked. Offered an appointment today and tomorrow but she could not do because of work. Would like an early morning if possible.

## 2017-11-25 ENCOUNTER — Ambulatory Visit (INDEPENDENT_AMBULATORY_CARE_PROVIDER_SITE_OTHER): Payer: 59 | Admitting: Certified Nurse Midwife

## 2017-11-25 ENCOUNTER — Encounter: Payer: Self-pay | Admitting: Certified Nurse Midwife

## 2017-11-25 ENCOUNTER — Other Ambulatory Visit: Payer: Self-pay

## 2017-11-25 VITALS — BP 124/82 | HR 70 | Ht 66.5 in | Wt 184.0 lb

## 2017-11-25 DIAGNOSIS — L02224 Furuncle of groin: Secondary | ICD-10-CM | POA: Diagnosis not present

## 2017-11-25 LAB — HCG, SERUM, QUALITATIVE: hCG,Beta Subunit,Qual,Serum: NEGATIVE m[IU]/mL (ref <6)

## 2017-11-25 LAB — COMPREHENSIVE METABOLIC PANEL
A/G RATIO: 1.7 (ref 1.2–2.2)
ALBUMIN: 3.9 g/dL (ref 3.5–5.5)
ALT: 12 IU/L (ref 0–32)
AST: 11 IU/L (ref 0–40)
Alkaline Phosphatase: 83 IU/L (ref 39–117)
BUN / CREAT RATIO: 11 (ref 9–23)
BUN: 10 mg/dL (ref 6–24)
Bilirubin Total: 0.2 mg/dL (ref 0.0–1.2)
CALCIUM: 9.2 mg/dL (ref 8.7–10.2)
CO2: 22 mmol/L (ref 20–29)
CREATININE: 0.93 mg/dL (ref 0.57–1.00)
Chloride: 106 mmol/L (ref 96–106)
GFR calc non Af Amer: 71 mL/min/{1.73_m2} (ref >59)
GFR, EST AFRICAN AMERICAN: 82 mL/min/{1.73_m2} (ref >59)
GLOBULIN, TOTAL: 2.3 g/dL (ref 1.5–4.5)
Glucose: 94 mg/dL (ref 65–99)
POTASSIUM: 4.3 mmol/L (ref 3.5–5.2)
SODIUM: 140 mmol/L (ref 134–144)
Total Protein: 6.2 g/dL (ref 6.0–8.5)

## 2017-11-25 MED ORDER — MEDROXYPROGESTERONE ACETATE 10 MG PO TABS: 10.0000 mg | ORAL_TABLET | Freq: Every day | ORAL | 0 refills | Status: DC

## 2017-11-25 MED ORDER — SULFAMETHOXAZOLE-TRIMETHOPRIM 800-160 MG PO TABS: ORAL_TABLET | ORAL | 0 refills | Status: DC

## 2017-11-25 NOTE — Progress Notes (Signed)
51 y.o. Widowed [place] American female [phone] here with complaint of boil in groin area which started last week and opened during the night, feels so much better. Has noted some white pus type discharge from area. Denies fever,chills or headache. Denies insect bite. "history of these several times a year". Has not used warm soaks to area or any ointment. No vaginal issues. Still has not started period, but was given lab results by CMA and aware she needs to start on Provera and advise about if bleeding occurs. No other health issues today.  .Review of Systems  Constitutional: Negative for chills, fever and malaise/fatigue.  Genitourinary: Negative.   Skin:       Skin boil in groin area that started a week ago  All pertinent to HPI.  O:Healthy female WDWN Affect: normal, orientation x 3   Exam:Skin: warm and dry Abdomen: soft, non  tender  Inguinal Lymph node: no enlargement or tenderness on left, but slight enlargement on right, non tender Pelvic exam: External genital: normal female with small boil noted on right in mons area, with slight blood tinged discharge, tender to palpate with small area of edema and light pink skin of 2 cm diameter around center, soft. Culture obtained by just touching area which is draining now. Area shown to patient. No other areas of concern noted of external genital area.   A: Boil in mons pubis area on right History of boils Amenorrhea under evaluation with labs   P:Discussed findings of boil and etiology. Discussed epsom salt tub bath or soak three times daily until resolves. warning signs of increase edema or redness or pain, patient needs to call and report. Can do OTC Advil or Tylenol if needed. Questions addressed regarding care and occurrence. Rx Bactrim DS see order with instructions.  Lab: wound culture Will change medication if culture indicates needed.  Recheck in one week, prn

## 2017-11-25 NOTE — Patient Instructions (Signed)
Skin Abscess A skin abscess is an infected area on or under your skin that contains pus and other material. An abscess can happen almost anywhere on your body. Some abscesses break open (rupture) on their own. Most continue to get worse unless they are treated. The infection can spread deeper into the body and into your blood, which can make you feel sick. Treatment usually involves draining the abscess. Follow these instructions at home: Abscess Care  If you have an abscess that has not drained, place a warm, clean, wet washcloth over the abscess several times a day. Do this as told by your doctor.  Follow instructions from your doctor about how to take care of your abscess. Make sure you: ? Cover the abscess with a bandage (dressing). ? Change your bandage or gauze as told by your doctor. ? Wash your hands with soap and water before you change the bandage or gauze. If you cannot use soap and water, use hand sanitizer.  Check your abscess every day for signs that the infection is getting worse. Check for: ? More redness, swelling, or pain. ? More fluid or blood. ? Warmth. ? More pus or a bad smell. Medicines   Take over-the-counter and prescription medicines only as told by your doctor.  If you were prescribed an antibiotic medicine, take it as told by your doctor. Do not stop taking the antibiotic even if you start to feel better. General instructions  To avoid spreading the infection: ? Do not share personal care items, towels, or hot tubs with others. ? Avoid making skin-to-skin contact with other people.  Keep all follow-up visits as told by your doctor. This is important. Contact a doctor if:  You have more redness, swelling, or pain around your abscess.  You have more fluid or blood coming from your abscess.  Your abscess feels warm when you touch it.  You have more pus or a bad smell coming from your abscess.  You have a fever.  Your muscles ache.  You have  chills.  You feel sick. Get help right away if:  You have very bad (severe) pain.  You see red streaks on your skin spreading away from the abscess. This information is not intended to replace advice given to you by your health care provider. Make sure you discuss any questions you have with your health care provider. Document Released: 05/31/2008 Document Revised: 08/08/2016 Document Reviewed: 10/22/2015 Elsevier Interactive Patient Education  2018 Elsevier Inc.  

## 2017-11-27 ENCOUNTER — Encounter (HOSPITAL_COMMUNITY): Payer: Self-pay | Admitting: Emergency Medicine

## 2017-11-27 ENCOUNTER — Emergency Department (HOSPITAL_COMMUNITY)
Admission: EM | Admit: 2017-11-27 | Discharge: 2017-11-27 | Disposition: A | Discharge status: Home / Self Care | Payer: 59 | Attending: Emergency Medicine | Admitting: Emergency Medicine

## 2017-11-27 DIAGNOSIS — E039 Hypothyroidism, unspecified: Secondary | ICD-10-CM | POA: Insufficient documentation

## 2017-11-27 DIAGNOSIS — L299 Pruritus, unspecified: Secondary | ICD-10-CM

## 2017-11-27 DIAGNOSIS — J4520 Mild intermittent asthma, uncomplicated: Secondary | ICD-10-CM | POA: Insufficient documentation

## 2017-11-27 DIAGNOSIS — T7840XA Allergy, unspecified, initial encounter: Secondary | ICD-10-CM

## 2017-11-27 DIAGNOSIS — Z853 Personal history of malignant neoplasm of breast: Secondary | ICD-10-CM | POA: Diagnosis not present

## 2017-11-27 DIAGNOSIS — R21 Rash and other nonspecific skin eruption: Secondary | ICD-10-CM | POA: Diagnosis present

## 2017-11-27 DIAGNOSIS — Z79899 Other long term (current) drug therapy: Secondary | ICD-10-CM | POA: Insufficient documentation

## 2017-11-27 DIAGNOSIS — I10 Essential (primary) hypertension: Secondary | ICD-10-CM | POA: Insufficient documentation

## 2017-11-27 DIAGNOSIS — F17210 Nicotine dependence, cigarettes, uncomplicated: Secondary | ICD-10-CM | POA: Diagnosis not present

## 2017-11-27 MED ORDER — DIPHENHYDRAMINE HCL 25 MG PO CAPS
50.0000 mg | ORAL_CAPSULE | Freq: Once | ORAL | Status: AC
  Administered 2017-11-27: 50 mg via ORAL
  Filled 2017-11-27: qty 2

## 2017-11-27 NOTE — Discharge Instructions (Signed)
Itching is likely due to an allergic reaction to Bactrim, you are likely allergic to Sulfa drugs in general, please discontinue this medication, since the boil seems to be healing well, with no signs of continued surrounding infection, I think it'll be safe to discontinue antibiotics altogether. Please continue doing warm Epsom salt soaks. You may continue to take Benadryl every 6 hours as needed if itching persists, but if itching worsens, is not responding to Benadryl or you develop rash, difficulty breathing, facial swelling or other concerning symptoms for which return to the ED.

## 2017-11-27 NOTE — ED Triage Notes (Addendum)
Pt reports she has a generalized body rash since this am accompanied by itching. Has not used any new body products.  Just started PO abx for ruptured abscess this am (sulfa abx).
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
## 2017-11-27 NOTE — ED Notes (Signed)
Bed: WTR7 Expected date:  Expected time:  Means of arrival:  Comments: 

## 2017-11-28 LAB — WOUND CULTURE: ORGANISM ID, BACTERIA: NONE SEEN

## 2017-12-02 ENCOUNTER — Ambulatory Visit: Payer: 59 | Admitting: Certified Nurse Midwife

## 2017-12-02 ENCOUNTER — Telehealth: Payer: Self-pay | Admitting: Certified Nurse Midwife

## 2017-12-02 ENCOUNTER — Encounter: Payer: Self-pay | Admitting: Certified Nurse Midwife

## 2017-12-02 NOTE — Telephone Encounter (Signed)
Attempted to call patient after she left message on answering machine cancelling her appointment today. Left voicemail message for her to call office back to get rescheduled if need to.

## 2017-12-07 NOTE — Telephone Encounter (Signed)
Left another message checking on patient to see if she needed to reschedule recheck appointment.

## 2018-02-03 ENCOUNTER — Other Ambulatory Visit: Payer: Self-pay | Admitting: Medical

## 2018-02-03 ENCOUNTER — Encounter (HOSPITAL_COMMUNITY): Payer: Self-pay | Admitting: Emergency Medicine

## 2018-02-03 ENCOUNTER — Emergency Department (HOSPITAL_COMMUNITY): Payer: 59

## 2018-02-03 ENCOUNTER — Other Ambulatory Visit: Payer: Self-pay

## 2018-02-03 ENCOUNTER — Emergency Department (HOSPITAL_COMMUNITY)
Admission: EM | Admit: 2018-02-03 | Discharge: 2018-02-03 | Disposition: A | Discharge status: Home / Self Care | Payer: 59 | Attending: Emergency Medicine | Admitting: Emergency Medicine

## 2018-02-03 DIAGNOSIS — I10 Essential (primary) hypertension: Secondary | ICD-10-CM | POA: Insufficient documentation

## 2018-02-03 DIAGNOSIS — F17200 Nicotine dependence, unspecified, uncomplicated: Secondary | ICD-10-CM | POA: Diagnosis not present

## 2018-02-03 DIAGNOSIS — E782 Mixed hyperlipidemia: Secondary | ICD-10-CM | POA: Diagnosis not present

## 2018-02-03 DIAGNOSIS — J40 Bronchitis, not specified as acute or chronic: Secondary | ICD-10-CM

## 2018-02-03 DIAGNOSIS — Z79899 Other long term (current) drug therapy: Secondary | ICD-10-CM | POA: Insufficient documentation

## 2018-02-03 DIAGNOSIS — E039 Hypothyroidism, unspecified: Secondary | ICD-10-CM | POA: Insufficient documentation

## 2018-02-03 DIAGNOSIS — J45909 Unspecified asthma, uncomplicated: Secondary | ICD-10-CM | POA: Insufficient documentation

## 2018-02-03 DIAGNOSIS — J209 Acute bronchitis, unspecified: Secondary | ICD-10-CM | POA: Insufficient documentation

## 2018-02-03 DIAGNOSIS — R05 Cough: Secondary | ICD-10-CM | POA: Diagnosis present

## 2018-02-03 LAB — URINALYSIS, ROUTINE W REFLEX MICROSCOPIC
Bilirubin Urine: NEGATIVE
GLUCOSE, UA: NEGATIVE mg/dL
KETONES UR: 20 mg/dL — AB
Leukocytes, UA: NEGATIVE
NITRITE: NEGATIVE
Protein, ur: 30 mg/dL — AB
SPECIFIC GRAVITY, URINE: 1.028 (ref 1.005–1.030)
pH: 5 (ref 5.0–8.0)

## 2018-02-03 MED ORDER — AZITHROMYCIN 250 MG PO TABS: ORAL_TABLET | ORAL | 0 refills | Status: DC

## 2018-02-03 MED ORDER — ALBUTEROL SULFATE HFA 108 (90 BASE) MCG/ACT IN AERS
2.0000 | INHALATION_SPRAY | RESPIRATORY_TRACT | Status: DC | PRN
  Administered 2018-02-03: 2 via RESPIRATORY_TRACT
  Filled 2018-02-03: qty 6.7

## 2018-02-03 MED ORDER — PREDNISONE 10 MG (21) PO TBPK: ORAL_TABLET | ORAL | 0 refills | Status: DC

## 2018-02-03 MED ORDER — IPRATROPIUM-ALBUTEROL 0.5-2.5 (3) MG/3ML IN SOLN
3.0000 mL | Freq: Once | RESPIRATORY_TRACT | Status: AC
  Administered 2018-02-03: 3 mL via RESPIRATORY_TRACT
  Filled 2018-02-03: qty 3

## 2018-02-03 MED ORDER — BENZONATATE 100 MG PO CAPS: 100.0000 mg | ORAL_CAPSULE | Freq: Three times a day (TID) | ORAL | 0 refills | Status: DC

## 2018-02-03 NOTE — ED Notes (Signed)
Bed: WTR6 Expected date:  Expected time:  Means of arrival:  Comments: 
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
## 2018-02-03 NOTE — ED Triage Notes (Signed)
Patient c/o body aches, cough that is dry, sore throat since Wed. Denies n/v/d.

## 2018-02-03 NOTE — Discharge Instructions (Signed)
Take the medications as directed. Use your inhaler as needed. Follow up with Dr. Glade Lloyd. Return here as needed.

## 2018-05-18 ENCOUNTER — Other Ambulatory Visit: Payer: Self-pay | Admitting: Medical

## 2018-07-03 ENCOUNTER — Ambulatory Visit (HOSPITAL_COMMUNITY)
Admission: EM | Admit: 2018-07-03 | Discharge: 2018-07-03 | Disposition: A | Discharge status: Home / Self Care | Payer: 59 | Attending: Family Medicine | Admitting: Family Medicine

## 2018-07-03 ENCOUNTER — Encounter (HOSPITAL_COMMUNITY): Payer: Self-pay | Admitting: Emergency Medicine

## 2018-07-03 DIAGNOSIS — L600 Ingrowing nail: Secondary | ICD-10-CM | POA: Diagnosis not present

## 2018-07-03 MED ORDER — IBUPROFEN 800 MG PO TABS: 800.0000 mg | ORAL_TABLET | Freq: Three times a day (TID) | ORAL | 0 refills | Status: DC

## 2018-07-03 MED ORDER — DOXYCYCLINE HYCLATE 100 MG PO CAPS: 100.0000 mg | ORAL_CAPSULE | Freq: Two times a day (BID) | ORAL | 0 refills | Status: DC
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
## 2018-07-03 NOTE — Discharge Instructions (Signed)
Continue to warm soak toe at least twice a day.  Complete course of antibiotics.   Please follow up with podiatry for definitive treatment of this nail.

## 2018-07-03 NOTE — ED Triage Notes (Signed)
PT has nail issue on right great toe. Area is red and painful.

## 2018-07-13 ENCOUNTER — Ambulatory Visit: Payer: 59 | Admitting: Family Medicine

## 2018-07-20 ENCOUNTER — Encounter: Payer: Self-pay | Admitting: Urgent Care

## 2018-07-20 ENCOUNTER — Ambulatory Visit (INDEPENDENT_AMBULATORY_CARE_PROVIDER_SITE_OTHER): Payer: 59

## 2018-07-20 ENCOUNTER — Other Ambulatory Visit: Payer: Self-pay

## 2018-07-20 ENCOUNTER — Ambulatory Visit (INDEPENDENT_AMBULATORY_CARE_PROVIDER_SITE_OTHER): Payer: 59 | Admitting: Urgent Care

## 2018-07-20 VITALS — BP 124/98 | HR 84 | Temp 99.0°F | Resp 16 | Ht 66.0 in | Wt 186.8 lb

## 2018-07-20 DIAGNOSIS — Z130 Encounter for screening for diseases of the blood and blood-forming organs and certain disorders involving the immune mechanism: Secondary | ICD-10-CM | POA: Diagnosis not present

## 2018-07-20 DIAGNOSIS — Z1329 Encounter for screening for other suspected endocrine disorder: Secondary | ICD-10-CM | POA: Diagnosis not present

## 2018-07-20 DIAGNOSIS — E782 Mixed hyperlipidemia: Secondary | ICD-10-CM

## 2018-07-20 DIAGNOSIS — Z1211 Encounter for screening for malignant neoplasm of colon: Secondary | ICD-10-CM

## 2018-07-20 DIAGNOSIS — Z1321 Encounter for screening for nutritional disorder: Secondary | ICD-10-CM

## 2018-07-20 DIAGNOSIS — M5441 Lumbago with sciatica, right side: Secondary | ICD-10-CM

## 2018-07-20 DIAGNOSIS — E039 Hypothyroidism, unspecified: Secondary | ICD-10-CM | POA: Diagnosis not present

## 2018-07-20 DIAGNOSIS — Z13228 Encounter for screening for other metabolic disorders: Secondary | ICD-10-CM

## 2018-07-20 DIAGNOSIS — F17200 Nicotine dependence, unspecified, uncomplicated: Secondary | ICD-10-CM

## 2018-07-20 DIAGNOSIS — Z566 Other physical and mental strain related to work: Secondary | ICD-10-CM

## 2018-07-20 DIAGNOSIS — F329 Major depressive disorder, single episode, unspecified: Secondary | ICD-10-CM

## 2018-07-20 DIAGNOSIS — F419 Anxiety disorder, unspecified: Secondary | ICD-10-CM

## 2018-07-20 DIAGNOSIS — Z8639 Personal history of other endocrine, nutritional and metabolic disease: Secondary | ICD-10-CM | POA: Diagnosis not present

## 2018-07-20 DIAGNOSIS — J3089 Other allergic rhinitis: Secondary | ICD-10-CM

## 2018-07-20 DIAGNOSIS — R062 Wheezing: Secondary | ICD-10-CM

## 2018-07-20 DIAGNOSIS — I10 Essential (primary) hypertension: Secondary | ICD-10-CM

## 2018-07-20 DIAGNOSIS — F32A Depression, unspecified: Secondary | ICD-10-CM

## 2018-07-20 DIAGNOSIS — Z0000 Encounter for general adult medical examination without abnormal findings: Secondary | ICD-10-CM | POA: Diagnosis not present

## 2018-07-20 DIAGNOSIS — J4540 Moderate persistent asthma, uncomplicated: Secondary | ICD-10-CM

## 2018-07-20 MED ORDER — METOPROLOL TARTRATE 25 MG PO TABS: 25.0000 mg | ORAL_TABLET | Freq: Two times a day (BID) | ORAL | 1 refills | Status: DC

## 2018-07-20 MED ORDER — PREDNISONE 20 MG PO TABS
ORAL_TABLET | ORAL | 0 refills | Status: DC
Start: 2018-07-20 — End: 2018-10-17

## 2018-07-20 MED ORDER — ATORVASTATIN CALCIUM 20 MG PO TABS: 20.0000 mg | ORAL_TABLET | Freq: Every day | ORAL | 3 refills | Status: DC

## 2018-07-20 MED ORDER — ALBUTEROL SULFATE HFA 108 (90 BASE) MCG/ACT IN AERS: 1.0000 | INHALATION_SPRAY | Freq: Four times a day (QID) | RESPIRATORY_TRACT | 5 refills | Status: DC | PRN

## 2018-07-20 MED ORDER — CETIRIZINE HCL 10 MG PO TABS: 10.0000 mg | ORAL_TABLET | Freq: Every day | ORAL | 3 refills | Status: DC

## 2018-07-20 MED ORDER — FLUTICASONE PROPIONATE 50 MCG/ACT NA SUSP: 2.0000 | Freq: Every day | NASAL | 11 refills | Status: DC

## 2018-07-20 MED ORDER — MONTELUKAST SODIUM 10 MG PO TABS: 10.0000 mg | ORAL_TABLET | Freq: Every day | ORAL | 3 refills | Status: DC

## 2018-07-20 MED ORDER — CYCLOBENZAPRINE HCL 5 MG PO TABS: 5.0000 mg | ORAL_TABLET | Freq: Three times a day (TID) | ORAL | 1 refills | Status: DC | PRN

## 2018-07-20 MED ORDER — LEVOTHYROXINE SODIUM 100 MCG PO TABS: 100.0000 ug | ORAL_TABLET | Freq: Every day | ORAL | 1 refills | Status: DC

## 2018-07-20 NOTE — Patient Instructions (Addendum)
Please make sure that you are hydrating with at least 2 L of water daily.  I will just start taking prednisone for your current sciatica flare, wheezing, allergic rhinitis (sinus flare).  You may use Flexeril to help with muscle relaxing of the low back, uses 3 times daily if it does not make you sleepy.  Otherwise it makes her really drowsy then use it at bedtime only.  I am restarting her metoprolol at 25 mg twice daily.  Also wants to restart Zyrtec and Flonase for your allergies.  I am adding Singulair to your medications which can help with both chronic allergies and asthma.  Make sure that you pick up Zyrtec on your own as it is not covered by insurance.  Please come back in about 6 weeks to recheck your thyroid levels but you may start your levothyroxine, your thyroid medication today.   [facility] [address] [place], [place] [address]  Burnard Leigh [phone]  Horton Finer [phone]  Everardo Beals [phone]   [facility] ([address] [place]) - [phone]  [facility] [place]) - [facility] - [phone]  [facility] [place] - [phone]  [facility]  - [phone] (do not file insurance)     Health Maintenance, Female Adopting a healthy lifestyle and getting preventive care can go a long way to promote health and wellness. Talk with your health care provider about what schedule of regular examinations is right for you. This is a good chance for you to check in with your provider about disease prevention and staying healthy. In between checkups, there are plenty of things you can do on your own. Experts have done a lot of research about which lifestyle changes and preventive measures are most likely to keep you healthy. Ask your health care provider for more  information. Weight and diet Eat a healthy diet  Be sure to include plenty of vegetables, fruits, low-fat dairy products, and lean protein.  Do not eat a lot of foods high in solid fats, added sugars, or salt.  Get regular exercise. This is one of the most important things you can do for your health. ? Most adults should exercise for at least 150 minutes each week. The exercise should increase your heart rate and make you sweat (moderate-intensity exercise). ? Most adults should also do strengthening exercises at least twice a week. This is in addition to the moderate-intensity exercise.  Maintain a healthy weight  Body mass index (BMI) is a measurement that can be used to identify possible weight problems. It estimates body fat based on height and weight. Your health care provider can help determine your BMI and help you achieve or maintain a healthy weight.  For females 31 years of age and older: ? A BMI below 18.5 is considered underweight. ? A BMI of 18.5 to 24.9 is normal. ? A BMI of 25 to 29.9 is considered overweight. ? A BMI of 30 and above is considered obese.  Watch levels of cholesterol and blood lipids  You should start having your blood tested for lipids and cholesterol at 53 years of age, then have this test every 5 years.  You may need to have your cholesterol levels checked more often if: ? Your lipid or cholesterol levels are high. ? You are older than 53 years of age. ? You are at high risk for heart disease.  Cancer screening Lung Cancer  Lung cancer screening is recommended for adults 28-40 years old who are at high risk for lung cancer because of a history of smoking.  A yearly low-dose CT scan of the lungs is recommended for people who: ? Currently smoke. ? Have quit within the past 15 years. ? Have at least a 30-pack-year history of smoking. A pack year is smoking an average of one pack of cigarettes a day for 1 year.  Yearly screening should continue  until it has been 15 years since you quit.  Yearly screening should stop if you develop a health problem that would prevent you from having lung cancer treatment.  Breast Cancer  Practice breast self-awareness. This means understanding how your breasts normally appear and feel.  It also means doing regular breast self-exams. Let your health care provider know about any changes, no matter how small.  If you are in your 20s or 30s, you should have a clinical breast exam (CBE) by a health care provider every 1-3 years as part of a regular health exam.  If you are 15 or older, have a CBE every year. Also consider having a breast X-ray (mammogram) every year.  If you have a family history of breast cancer, talk to your health care provider about genetic screening.  If you are at high risk for breast cancer, talk to your health care provider about having an MRI and a mammogram every year.  Breast cancer gene (BRCA) assessment is recommended for women who have family members with BRCA-related cancers. BRCA-related cancers include: ? Breast. ? Ovarian. ? Tubal. ? Peritoneal cancers.  Results of the assessment will determine the need for genetic counseling and BRCA1 and BRCA2 testing.  Cervical Cancer Your health care provider may recommend that you be screened regularly for cancer of the pelvic organs (ovaries, uterus, and vagina). This screening involves a pelvic examination, including checking for microscopic changes to the surface of your cervix (Pap test). You may be encouraged to have this screening done every 3 years, beginning at age 65.  For women ages 31-65, health care providers may recommend pelvic exams and Pap testing every 3 years, or they may recommend the Pap and pelvic exam, combined with testing for human papilloma virus (HPV), every 5 years. Some types of HPV increase your risk of cervical cancer. Testing for HPV may also be done on women of any age with unclear Pap test  results.  Other health care providers may not recommend any screening for nonpregnant women who are considered low risk for pelvic cancer and who do not have symptoms. Ask your health care provider if a screening pelvic exam is right for you.  If you have had past treatment for cervical cancer or a condition that could lead to cancer, you need Pap tests and screening for cancer for at least 20 years after your treatment. If Pap tests have been discontinued, your risk factors (such as having a new sexual partner) need to be reassessed to determine if screening should resume. Some women have medical problems that increase the chance of getting cervical cancer. In these cases, your health care provider may recommend more frequent screening and Pap tests.  Colorectal Cancer  This type of cancer can be detected and often prevented.  Routine colorectal cancer screening usually begins at 53 years of age and continues through 53 years of age.  Your health care provider may recommend screening at an earlier age if you have risk factors  for colon cancer.  Your health care provider may also recommend using home test kits to check for hidden blood in the stool.  A small camera at the end of a tube can be used to examine your colon directly (sigmoidoscopy or colonoscopy). This is done to check for the earliest forms of colorectal cancer.  Routine screening usually begins at age 89.  Direct examination of the colon should be repeated every 5-10 years through 53 years of age. However, you may need to be screened more often if early forms of precancerous polyps or small growths are found.  Skin Cancer  Check your skin from head to toe regularly.  Tell your health care provider about any new moles or changes in moles, especially if there is a change in a mole's shape or color.  Also tell your health care provider if you have a mole that is larger than the size of a pencil eraser.  Always use sunscreen.  Apply sunscreen liberally and repeatedly throughout the day.  Protect yourself by wearing long sleeves, pants, a wide-brimmed hat, and sunglasses whenever you are outside.  Heart disease, diabetes, and high blood pressure  High blood pressure causes heart disease and increases the risk of stroke. High blood pressure is more likely to develop in: ? People who have blood pressure in the high end of the normal range (130-139/85-89 mm Hg). ? People who are overweight or obese. ? People who are African American.  If you are 2-62 years of age, have your blood pressure checked every 3-5 years. If you are 91 years of age or older, have your blood pressure checked every year. You should have your blood pressure measured twice-once when you are at a hospital or clinic, and once when you are not at a hospital or clinic. Record the average of the two measurements. To check your blood pressure when you are not at a hospital or clinic, you can use: ? An automated blood pressure machine at a pharmacy. ? A home blood pressure monitor.  If you are between 38 years and 58 years old, ask your health care provider if you should take aspirin to prevent strokes.  Have regular diabetes screenings. This involves taking a blood sample to check your fasting blood sugar level. ? If you are at a normal weight and have a low risk for diabetes, have this test once every three years after 53 years of age. ? If you are overweight and have a high risk for diabetes, consider being tested at a younger age or more often. Preventing infection Hepatitis B  If you have a higher risk for hepatitis B, you should be screened for this virus. You are considered at high risk for hepatitis B if: ? You were born in a country where hepatitis B is common. Ask your health care provider which countries are considered high risk. ? Your parents were born in a high-risk country, and you have not been immunized against hepatitis B (hepatitis B  vaccine). ? You have HIV or AIDS. ? You use needles to inject street drugs. ? You live with someone who has hepatitis B. ? You have had sex with someone who has hepatitis B. ? You get hemodialysis treatment. ? You take certain medicines for conditions, including cancer, organ transplantation, and autoimmune conditions.  Hepatitis C  Blood testing is recommended for: ? Everyone born from [birth year] through 1966. ? Anyone with known risk factors for hepatitis C.  Sexually transmitted infections (STIs)  You should be screened for sexually transmitted infections (STIs) including gonorrhea and chlamydia if: ? You are sexually active and are younger than 53 years of age. ? You are older than 53 years of age and your health care provider tells you that you are at risk for this type of infection. ? Your sexual activity has changed since you were last screened and you are at an increased risk for chlamydia or gonorrhea. Ask your health care provider if you are at risk.  If you do not have HIV, but are at risk, it may be recommended that you take a prescription medicine daily to prevent HIV infection. This is called pre-exposure prophylaxis (PrEP). You are considered at risk if: ? You are sexually active and do not regularly use condoms or know the HIV status of your partner(s). ? You take drugs by injection. ? You are sexually active with a partner who has HIV.  Talk with your health care provider about whether you are at high risk of being infected with HIV. If you choose to begin PrEP, you should first be tested for HIV. You should then be tested every 3 months for as long as you are taking PrEP. Pregnancy  If you are premenopausal and you may become pregnant, ask your health care provider about preconception counseling.  If you may become pregnant, take 400 to 800 micrograms (mcg) of folic acid every day.  If you want to prevent pregnancy, talk to your health care provider about birth control  (contraception). Osteoporosis and menopause  Osteoporosis is a disease in which the bones lose minerals and strength with aging. This can result in serious bone fractures. Your risk for osteoporosis can be identified using a bone density scan.  If you are 8 years of age or older, or if you are at risk for osteoporosis and fractures, ask your health care provider if you should be screened.  Ask your health care provider whether you should take a calcium or vitamin D supplement to lower your risk for osteoporosis.  Menopause may have certain physical symptoms and risks.  Hormone replacement therapy may reduce some of these symptoms and risks. Talk to your health care provider about whether hormone replacement therapy is right for you. Follow these instructions at home:  Schedule regular health, dental, and eye exams.  Stay current with your immunizations.  Do not use any tobacco products including cigarettes, chewing tobacco, or electronic cigarettes.  If you are pregnant, do not drink alcohol.  If you are breastfeeding, limit how much and how often you drink alcohol.  Limit alcohol intake to no more than 1 drink per day for nonpregnant women. One drink equals 12 ounces of beer, 5 ounces of wine, or 1 ounces of hard liquor.  Do not use street drugs.  Do not share needles.  Ask your health care provider for help if you need support or information about quitting drugs.  Tell your health care provider if you often feel depressed.  Tell your health care provider if you have ever been abused or do not feel safe at home. This information is not intended to replace advice given to you by your health care provider. Make sure you discuss any questions you have with your health care provider. Document Released: 06/28/2011 Document Revised: 05/20/2016 Document Reviewed: 09/16/2015 Elsevier Interactive Patient Education  2018 [employer].     IF you received an x-ray today, you will  receive an invoice from [facility]. Please contact [facility]  Radiology at [phone] with questions or concerns regarding your invoice.   IF you received labwork today, you will receive an invoice from [facility]. Please contact [facility] at [phone] with questions or concerns regarding your invoice.   Our billing staff will not be able to assist you with questions regarding bills from these companies.  You will be contacted with the lab results as soon as they are available. The fastest way to get your results is to activate your My Chart account. Instructions are located on the last page of this paperwork. If you have not heard from [place] regarding the results in 2 weeks, please contact this office.

## 2018-07-20 NOTE — Progress Notes (Signed)
MRN: [id number]  Subjective:   Megan Bullock is a 52 y.o. female presenting for annual physical exam. Smokes Black & Milds, 4 per day. Has been smoking more than 30 years.  No alcohol use. Patient works in Therapist, art, call center.  Patient is widowed, has 2 children. Has a lot of work related stress. Her employer offers resources to help with anxiety and depression.   Medical care team includes: PCP: Patient, No Pcp Per Vision: Wears contacts, has yearly eye exams.  Dental: Wears dentures.  OB/GYN: Last pap smear was 2018, was normal. Has a mammogram yearly, is scheduled for a repeat 07/22/2018. Specialists: None.  Health Maintenance: She is due for a colonoscopy.   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  Reports that it is very difficult working with very demanding and upset customers.  Reports that she cannot give up smoking because of the stress that she has from her work.  She denies SI, HI.  She has tried meditation, yoga, exercise to help with her stress, anxiety and depression.  She does admit that she plans on using resources from work to help with her symptoms.  She has never previously tried medication for help with her anxiety depression but has used the results from her work in the past successfully.  Otherwise she does admit that she has a good support network.  Asthma - Reports significant wheezing almost every night for the past several weeks.  Has 3 total inhalers but does the best with Ventolin.  She is requesting a refill today.  She does not have a prescription for an inhaled steroid.  Denies review of systems as below.  Allergies - Reports multiple headaches intermittently throughout the week, sinus congestion, postnasal drainage, throat pain, intermittent bilateral ear pain, currently her right ear bothers her more.  She is previously taking Flonase for management of her  allergies in the past but does not have a prescription currently.  She is requesting refill.  Thyroid disease - Has story of Graves' disease, underwent thyroid ablation therapy.  Thereafter she needed thyroid medication to help manage acquired hypothyroidism.  She ran out of her medication about a month ago and has not taken anything since then.  Has previously had trouble with palpitations, heart racing from her thyroid medication.  She was last taken 170 mcg once daily.  HTN - Is currently managed with metoprolol, she was prescribed 25 mg twice daily but has been taking it just once daily.  Reports that her pressures have been high.  States that she was advised to have a recheck given that she is at high blood pressure readings greater than 262 systolic.  She does not check them regularly.  Diet is not actively healthy.  She is not trying to maintain regular exercise lately.  This is due to the stress as described above.  Denies review of systems as below.  HL - She was previously being prescribed a cholesterol medication.  She currently does not have any medication for this.  Reports that her previous PCP did not want to refill the medication.  Megan Bullock has a current medication list which includes the following prescription(s): albuterol, ibuprofen, levothyroxine, and metoprolol tartrate. She is allergic to bactrim [sulfamethoxazole-trimethoprim]; chantix [varenicline]; and wellbutrin [bupropion].  [name]  has a past medical history of Abnormal Pap smear of cervix, Allergy, Asthma, FH: breast cancer, GERD (gastroesophageal reflux disease), H/O mammogram, History  of Graves' disease, Hypothyroidism, Mixed dyslipidemia (2013), Palpitations (03/2012), Routine gynecological examination (02/2012), Tobacco use disorder, Wears contact lenses, and Wears dentures. Also  has a past surgical history that includes Facial reconstruction surgery and Shoulder surgery. Her family history includes Aneurysm in her maternal aunt  and mother; Cancer in her maternal aunt, maternal grandmother, and mother; Diabetes in her father; Heart disease in her maternal aunt; Hypertension in her brother and mother; Stroke in her mother; Thyroid disease in her brother.  Review of Systems  Constitutional: Positive for malaise/fatigue. Negative for chills, diaphoresis, fever and weight loss.  HENT: Positive for congestion, ear pain and sore throat. Negative for ear discharge, hearing loss, nosebleeds and tinnitus.   Eyes: Negative for blurred vision, double vision, photophobia, pain, discharge and redness.  Respiratory: Positive for shortness of breath and wheezing. Negative for cough.   Cardiovascular: Negative for chest pain, palpitations and leg swelling.  Gastrointestinal: Positive for heartburn. Negative for abdominal pain, blood in stool, constipation, diarrhea, nausea and vomiting.  Genitourinary: Negative for dysuria, flank pain, frequency, hematuria and urgency.  Musculoskeletal: Positive for back pain. Negative for joint pain and myalgias.  Skin: Negative for itching and rash.  Neurological: Positive for headaches. Negative for dizziness, tingling, seizures, loss of consciousness and weakness.  Endo/Heme/Allergies: Negative for polydipsia.  Psychiatric/Behavioral: Positive for depression. Negative for hallucinations, memory loss, substance abuse and suicidal ideas. The patient is nervous/anxious. The patient does not have insomnia.    Objective:   Vitals: BP (!) 124/98   Pulse 84   Temp 99 F (37.2 C)   Resp 16   Ht 5\' 6"  (1.676 m)   Wt 186 lb 12.8 oz (84.7 kg)   SpO2 100%   BMI 30.15 kg/m   BP Readings from Last 3 Encounters:  07/20/18 (!) 124/98  07/03/18 (!) 156/92  02/03/18 (!) 129/92    The 10-year ASCVD risk score Mikey Bussing DC Jr., et al., 2013) is: 8.8%   Values used to calculate the score:     Age: 65 years     Sex: Female     Is Non-Hispanic African American: Yes     Diabetic: No     Tobacco smoker:  Yes     Systolic Blood Pressure: 503 mmHg     Is BP treated: Yes     HDL Cholesterol: 43 mg/dL     Total Cholesterol: 189 mg/dL   Physical Exam  Constitutional: She is oriented to person, place, and time. She appears well-developed and well-nourished.  HENT:  TM's opaque but intact bilaterally, no effusions or erythema. Nasal turbinates boggy and edematous, mucosal edema is present, rhinorrhea noted, nasal passages minimally patent.  Frontal sinus tenderness. Oropharynx significant postnasal drainage, mucous membranes moist.  Dentures in place.  Eyes: Pupils are equal, round, and reactive to light. Conjunctivae and EOM are normal. Right eye exhibits no discharge. Left eye exhibits no discharge. No scleral icterus.  Neck: Normal range of motion. Neck supple. No thyromegaly present.  Cardiovascular: Normal rate, regular rhythm, normal heart sounds and intact distal pulses. Exam reveals no gallop and no friction rub.  No murmur heard. Pulmonary/Chest: Effort normal. No stridor. No respiratory distress. She has no wheezes. She has no rales.  Diminished lung sounds throughout.  Abdominal: Soft. Bowel sounds are normal. She exhibits no distension and no mass. There is no tenderness. There is no rebound and no guarding.  Musculoskeletal: She exhibits no edema.       Cervical back: She exhibits normal range  of motion, no tenderness, no bony tenderness, no swelling, no edema, no deformity, no laceration and no spasm.       Thoracic back: She exhibits normal range of motion, no tenderness, no bony tenderness, no swelling, no edema, no deformity, no laceration and no spasm.       Lumbar back: She exhibits decreased range of motion (full flexion, extension), tenderness (over area depicted) and pain (positive SLR to the right). She exhibits no bony tenderness, no swelling, no edema, no deformity, no laceration, no spasm and normal pulse.       Back:  Lymphadenopathy:    She has no cervical adenopathy.   Neurological: She is alert and oriented to person, place, and time. She has normal reflexes. She displays normal reflexes. Coordination normal.  Skin: Skin is warm and dry. No rash noted. No erythema. No pallor.  Psychiatric: She has a normal mood and affect.   Dg Lumbar Spine Complete  Result Date: 07/20/2018 CLINICAL DATA:  Low back pain with radiculopathy. Acute bilateral low back pain with right-sided sciatica. EXAM: LUMBAR SPINE - COMPLETE 4+ VIEW COMPARISON:  None. FINDINGS: Five non rib-bearing lumbar type vertebral bodies are present. Vertebral body heights are maintained. There is some thinning of the disc space at L4-5 and L5-S1. Moderate facet degenerative changes are present at L4-5 and L5-S1 bilaterally without significant asymmetry. Atherosclerotic calcifications are present in the aorta without aneurysm. IMPRESSION: 1. Facet degenerative changes at L4-5 and L5-S1. 2. Mild thinning of the disc space at L4-5 and L5-S1. 3. No acute abnormality. Electronically Signed   By: San Morelle M.D.   On: 07/20/2018 15:25   Assessment and Plan :   Annual physical exam  Screening for endocrine, nutritional, metabolic and immunity disorder - Plan: Comprehensive metabolic panel  Screening for deficiency anemia - Plan: CBC  Has been smoking tobacco for 30 years or more - Plan: Ambulatory Referral for Lung Cancer Scre  Tobacco use disorder  Screen for colon cancer  Mixed hyperlipidemia - Plan: Lipid panel  Acquired hypothyroidism - Plan: TSH, T4, Free, T3, Free  History of Graves' disease - Plan: TSH, T4, Free, T3, Free  Essential hypertension - Plan: Microalbumin / creatinine urine ratio  Acute bilateral low back pain with right-sided sciatica - Plan: DG Lumbar Spine Complete  Moderate persistent asthma without complication  Wheezing  Non-seasonal allergic rhinitis due to other allergic trigger  Anxiety and depression  Work-related stress  CPE - Patient is medically  stable, labs pending.  She is a very pleasant person under a lot of stress from her work.  We discussed her smoking cessation but she is not ready for this currently. Discussed healthy lifestyle, diet, exercise, preventative care, vaccinations, and addressed patient's concerns.  Referral pending for lung cancer screening and colon cancer screening.  OV - I will have patient restart her levothyroxine at 100 mcg once daily, labs pending.  The plan is to titrate slowly to what her previous dose was.  We will recheck her labs again in 6 weeks.  I refilled her metoprolol medication at 25 mg twice daily given her recent blood pressure readings.  Patient is to practice healthier diet.  Restart exercise.  I will have patient start at 20 mg atorvastatin given the ASCVD risk calculator for greater than 8%.  Patient is to restart her allergy medicines including Flonase and will add Zyrtec and Singulair which may also help with her asthma.  I will have patient use a short steroid course  to manage her current allergic rhinitis, wheezing.  I refilled her albuterol inhaler for the same.  She is also to use the steroid course for her recurrent sciatica.  Flexeril can be used for muscle relaxing properties.  Recommended patient hydrate aggressively. Counseled patient on potential for adverse effects with medications prescribed today, patient verbalized understanding. A total of 50 minutes were spent face-to-face with the patient during this encounter and over half of that time was spent on discussing treatment options, potential for side effects with medications, counseling and coordination of care.     Jaynee Eagles, PA-C [facility] [phone] 07/20/2018  2:33 PM

## 2018-07-21 LAB — COMPREHENSIVE METABOLIC PANEL
A/G RATIO: 2 (ref 1.2–2.2)
ALK PHOS: 76 IU/L (ref 39–117)
ALT: 14 IU/L (ref 0–32)
AST: 14 IU/L (ref 0–40)
Albumin: 4.3 g/dL (ref 3.5–5.5)
BUN/Creatinine Ratio: 9 (ref 9–23)
BUN: 9 mg/dL (ref 6–24)
Bilirubin Total: 0.3 mg/dL (ref 0.0–1.2)
CO2: 22 mmol/L (ref 20–29)
Calcium: 9.3 mg/dL (ref 8.7–10.2)
Chloride: 104 mmol/L (ref 96–106)
Creatinine, Ser: 1.01 mg/dL — ABNORMAL HIGH (ref 0.57–1.00)
GFR calc Af Amer: 74 mL/min/{1.73_m2} (ref >59)
GFR calc non Af Amer: 64 mL/min/{1.73_m2} (ref >59)
GLOBULIN, TOTAL: 2.2 g/dL (ref 1.5–4.5)
Glucose: 79 mg/dL (ref 65–99)
POTASSIUM: 3.4 mmol/L — AB (ref 3.5–5.2)
SODIUM: 140 mmol/L (ref 134–144)
Total Protein: 6.5 g/dL (ref 6.0–8.5)

## 2018-07-21 LAB — CBC
Hematocrit: 38.7 % (ref 34.0–46.6)
Hemoglobin: 13 g/dL (ref 11.1–15.9)
MCH: 29.4 pg (ref 26.6–33.0)
MCHC: 33.6 g/dL (ref 31.5–35.7)
MCV: 88 fL (ref 79–97)
Platelets: 138 10*3/uL — ABNORMAL LOW (ref 150–450)
RBC: 4.42 x10E6/uL (ref 3.77–5.28)
RDW: 16.1 % — ABNORMAL HIGH (ref 12.3–15.4)
WBC: 7 10*3/uL (ref 3.4–10.8)

## 2018-07-21 LAB — T3, FREE

## 2018-07-21 LAB — MICROALBUMIN / CREATININE URINE RATIO
Creatinine, Urine: 254.1 mg/dL
Microalb/Creat Ratio: 11.1 mg/g creat (ref 0.0–30.0)
Microalbumin, Urine: 28.2 ug/mL

## 2018-07-21 LAB — LIPID PANEL
CHOL/HDL RATIO: 6.5 ratio — AB (ref 0.0–4.4)
CHOLESTEROL TOTAL: 320 mg/dL — AB (ref 100–199)
HDL: 49 mg/dL (ref >39)
LDL CALC: 225 mg/dL — AB (ref 0–99)
Triglycerides: 232 mg/dL — ABNORMAL HIGH (ref 0–149)
VLDL Cholesterol Cal: 46 mg/dL — ABNORMAL HIGH (ref 5–40)

## 2018-07-21 LAB — TSH: TSH: 155.8 u[IU]/mL — AB (ref 0.450–4.500)

## 2018-07-21 LAB — T4, FREE

## 2018-08-04 ENCOUNTER — Telehealth: Payer: Self-pay | Admitting: General Practice

## 2018-08-04 NOTE — Telephone Encounter (Signed)
Called pt to let them know we would need to reschedule their appt 08/29/18. Pt. Will call back

## 2018-08-29 ENCOUNTER — Ambulatory Visit: Payer: 59 | Admitting: Urgent Care

## 2018-09-17 ENCOUNTER — Other Ambulatory Visit: Payer: Self-pay | Admitting: Urgent Care

## 2018-09-18 NOTE — Telephone Encounter (Signed)
montelukast sod 10 mg  refill Last Refill:07/20/18 # 90 and 3 refills Last OV: 07/20/18 PCP: [place]: [facility] [phone]  Pharmacy requesting for 4 refills.

## 2018-09-19 NOTE — Telephone Encounter (Signed)
Called pt to try and get her in for a med refill before Mani leaves. If pt calls back, please transfer her to [place] or [place].  Thank you!

## 2018-10-17 ENCOUNTER — Other Ambulatory Visit: Payer: Self-pay

## 2018-10-17 ENCOUNTER — Encounter: Payer: Self-pay | Admitting: Physician Assistant

## 2018-10-17 ENCOUNTER — Ambulatory Visit (INDEPENDENT_AMBULATORY_CARE_PROVIDER_SITE_OTHER): Payer: 59 | Admitting: Physician Assistant

## 2018-10-17 VITALS — BP 128/92 | HR 85 | Temp 98.6°F | Resp 16 | Ht 67.0 in | Wt 187.2 lb

## 2018-10-17 DIAGNOSIS — M5416 Radiculopathy, lumbar region: Secondary | ICD-10-CM

## 2018-10-17 DIAGNOSIS — L600 Ingrowing nail: Secondary | ICD-10-CM

## 2018-10-17 MED ORDER — PREDNISONE 20 MG PO TABS: ORAL_TABLET | ORAL | 0 refills | Status: DC

## 2018-10-17 NOTE — Patient Instructions (Addendum)
Start taking Prednisone 3 pills x 3 days, then 2 pills x 3 days, then 1 pill x 3 days.   You will receive a phone call (in 1-2 weeks) to schedule an appointment with physical therapy and podiatry.   Come back if your back pain is not improving after physical therapy.     Sciatica Sciatica is pain, numbness, weakness, or tingling along the path of the sciatic nerve. The sciatic nerve starts in the lower back and runs down the back of each leg. The nerve controls the muscles in the lower leg and in the back of the knee. It also provides feeling (sensation) to the back of the thigh, the lower leg, and the sole of the foot. Sciatica is a symptom of another medical condition that pinches or puts pressure on the sciatic nerve. Generally, sciatica only affects one side of the body. Sciatica usually goes away on its own or with treatment. In some cases, sciatica may keep coming back (recur). What are the causes? This condition is caused by pressure on the sciatic nerve, or pinching of the sciatic nerve. This may be the result of:  A disk in between the bones of the spine (vertebrae) bulging out too far (herniated disk).  Age-related changes in the spinal disks (degenerative disk disease).  A pain disorder that affects a muscle in the buttock (piriformis syndrome).  Extra bone growth (bone spur) near the sciatic nerve.  An injury or break (fracture) of the pelvis.  Pregnancy.  Tumor (rare).  What increases the risk? The following factors may make you more likely to develop this condition:  Playing sports that place pressure or stress on the spine, such as football or weight lifting.  Having poor strength and flexibility.  A history of back injury.  A history of back surgery.  Sitting for long periods of time.  Doing activities that involve repetitive bending or lifting.  Obesity.  What are the signs or symptoms? Symptoms can vary from mild to very severe, and they may  include:  Any of these problems in the lower back, leg, hip, or buttock: ? Mild tingling or dull aches. ? Burning sensations. ? Sharp pains.  Numbness in the back of the calf or the sole of the foot.  Leg weakness.  Severe back pain that makes movement difficult.  These symptoms may get worse when you cough, sneeze, or laugh, or when you sit or stand for long periods of time. Being overweight may also make symptoms worse. In some cases, symptoms may recur over time. How is this diagnosed? This condition may be diagnosed based on:  Your symptoms.  A physical exam. Your health care provider may ask you to do certain movements to check whether those movements trigger your symptoms.  You may have tests, including: ? Blood tests. ? X-rays. ? MRI. ? CT scan.  How is this treated? In many cases, this condition improves on its own, without any treatment. However, treatment may include:  Reducing or modifying physical activity during periods of pain.  Exercising and stretching to strengthen your abdomen and improve the flexibility of your spine.  Icing and applying heat to the affected area.  Medicines that help: ? To relieve pain and swelling. ? To relax your muscles.  Injections of medicines that help to relieve pain, irritation, and inflammation around the sciatic nerve (steroids).  Surgery.  Follow these instructions at home: Medicines  Take over-the-counter and prescription medicines only as told by your health care  provider.  Do not drive or operate heavy machinery while taking prescription pain medicine. Managing pain  If directed, apply ice to the affected area. ? Put ice in a plastic bag. ? Place a towel between your skin and the bag. ? Leave the ice on for 20 minutes, 2-3 times a day.  After icing, apply heat to the affected area before you exercise or as often as told by your health care provider. Use the heat source that your health care provider  recommends, such as a moist heat pack or a heating pad. ? Place a towel between your skin and the heat source. ? Leave the heat on for 20-30 minutes. ? Remove the heat if your skin turns bright red. This is especially important if you are unable to feel pain, heat, or cold. You may have a greater risk of getting burned. Activity  Return to your normal activities as told by your health care provider. Ask your health care provider what activities are safe for you. ? Avoid activities that make your symptoms worse.  Take brief periods of rest throughout the day. Resting in a lying or standing position is usually better than sitting to rest. ? When you rest for longer periods, mix in some mild activity or stretching between periods of rest. This will help to prevent stiffness and pain. ? Avoid sitting for long periods of time without moving. Get up and move around at least one time each hour.  Exercise and stretch regularly, as told by your health care provider.  Do not lift anything that is heavier than 10 lb (4.5 kg) while you have symptoms of sciatica. When you do not have symptoms, you should still avoid heavy lifting, especially repetitive heavy lifting.  When you lift objects, always use proper lifting technique, which includes: ? Bending your knees. ? Keeping the load close to your body. ? Avoiding twisting. General instructions  Use good posture. ? Avoid leaning forward while sitting. ? Avoid hunching over while standing.  Maintain a healthy weight. Excess weight puts extra stress on your back and makes it difficult to maintain good posture.  Wear supportive, comfortable shoes. Avoid wearing high heels.  Avoid sleeping on a mattress that is too soft or too hard. A mattress that is firm enough to support your back when you sleep may help to reduce your pain.  Keep all follow-up visits as told by your health care provider. This is important. Contact a health care provider  if:  You have pain that wakes you up when you are sleeping.  You have pain that gets worse when you lie down.  Your pain is worse than you have experienced in the past.  Your pain lasts longer than 4 weeks.  You experience unexplained weight loss. Get help right away if:  You lose control of your bowel or bladder (incontinence).  You have: ? Weakness in your lower back, pelvis, buttocks, or legs that gets worse. ? Redness or swelling of your back. ? A burning sensation when you urinate. This information is not intended to replace advice given to you by your health care provider. Make sure you discuss any questions you have with your health care provider. Document Released: 12/07/2001 Document Revised: 05/18/2016 Document Reviewed: 08/22/2015 Elsevier Interactive Patient Education  2018 [employer].   IF you received an x-ray today, you will receive an invoice from [facility]. Please contact [facility] at [phone] with questions or concerns regarding your invoice.   IF  you received labwork today, you will receive an invoice from [facility]. Please contact [facility] at [phone] with questions or concerns regarding your invoice.   Our billing staff will not be able to assist you with questions regarding bills from these companies.  You will be contacted with the lab results as soon as they are available. The fastest way to get your results is to activate your My Chart account. Instructions are located on the last page of this paperwork. If you have not heard from [place] regarding the results in 2 weeks, please contact this office.

## 2018-10-17 NOTE — Progress Notes (Signed)
Megan Bullock  MRN: [id number] DOB: 28-Mar-1966  PCP: Patient, No Pcp Per  Subjective:  Pt is a 52 year old female who presents to clinic for back pain x several months.  Pain is located on the lower right back. Radiates down leg.  Endorses burning of anterior right thigh x 2 weeks. Comes and goes.  Worse with standing or walking.   She has taken Flexeril in the past. It helps, but makes her sleepy.  She enjoys walking and can no longer do this due to back pain. Denies decreased ROM, muscle weakness, loss of bowel or bladder function, urinary symptoms.   She was seen for this problem at her annual exam with Rosario Adie 3 months ago. She does not recall taking Prednisone which was prescribed at the time.    She also c/o ingrown toe nail of right great toe. Has to wear pads b/t toes to help relieve discomfort. She has never had this removed.   Pt  has a past medical history of Abnormal Pap smear of cervix, Allergy, Asthma, FH: breast cancer, GERD (gastroesophageal reflux disease), H/O mammogram, History of Graves' disease, Hypothyroidism, Mixed dyslipidemia (2013), Palpitations (03/2012), Routine gynecological examination (02/2012), Tobacco use disorder, Wears contact lenses, and Wears dentures.  Review of Systems  Constitutional: Negative for chills and fever.  Gastrointestinal: Negative for abdominal pain, constipation and diarrhea.  Genitourinary: Negative for decreased urine volume, difficulty urinating, dysuria, enuresis, flank pain and urgency.  Musculoskeletal: Positive for back pain.  Neurological: Positive for numbness ("burning'). Negative for weakness.    Patient Active Problem List   Diagnosis Date Noted  . Essential hypertension 01/20/2016  . Cephalalgia 01/20/2016  . Anxiety state 01/20/2016  . Insomnia 06/02/2015  . Smoker 06/02/2015  . Asthma, mild intermittent 06/02/2015  . Mixed dyslipidemia 02/20/2014  . Allergic rhinitis, seasonal 06/01/2011  . FH: breast cancer    . History of Graves' disease 11/18/2008  . Hypothyroidism 11/18/2008    Current Outpatient Medications on File Prior to Visit  Medication Sig Dispense Refill  . albuterol (PROVENTIL HFA;VENTOLIN HFA) 108 (90 Base) MCG/ACT inhaler Inhale 1-2 puffs into the lungs every 6 (six) hours as needed for wheezing or shortness of breath. 18 g 5  . atorvastatin (LIPITOR) 20 MG tablet Take 1 tablet (20 mg total) by mouth daily. 90 tablet 3  . cetirizine (ZYRTEC) 10 MG tablet Take 1 tablet (10 mg total) by mouth daily. 90 tablet 3  . cyclobenzaprine (FLEXERIL) 5 MG tablet Take 1 tablet (5 mg total) by mouth 3 (three) times daily as needed. 90 tablet 1  . levothyroxine (SYNTHROID, LEVOTHROID) 100 MCG tablet Take 1 tablet (100 mcg total) by mouth daily before breakfast. 90 tablet 1  . metoprolol tartrate (LOPRESSOR) 25 MG tablet Take 1 tablet (25 mg total) by mouth 2 (two) times daily. 180 tablet 1  . montelukast (SINGULAIR) 10 MG tablet TAKE 1 TABLET BY MOUTH EVERYDAY AT BEDTIME 90 tablet 4  . fluticasone (FLONASE) 50 MCG/ACT nasal spray Place 2 sprays into both nostrils daily. (Patient not taking: Reported on 10/17/2018) 16 g 11   No current facility-administered medications on file prior to visit.     Allergies  Allergen Reactions  . Bactrim [Sulfamethoxazole-Trimethoprim] Itching  . Chantix [Varenicline]     Not tolerated, weird dreams if taken with Wellbutrin together  . Wellbutrin [Bupropion]     Not tolerated if taken simultaneously with Chantix     Objective:  BP (!) 128/92 (BP Location: Left  Arm, Patient Position: Sitting, Cuff Size: Normal)   Pulse 85   Temp 98.6 F (37 C) (Oral)   Resp 16   Ht 5\' 7"  (1.702 m)   Wt 187 lb 3.2 oz (84.9 kg)   LMP 09/23/2018   SpO2 100%   BMI 29.32 kg/m   Physical Exam  Constitutional: She appears well-developed and well-nourished. No distress.  Musculoskeletal:       Lumbar back: She exhibits tenderness and bony tenderness. She exhibits normal  range of motion.       Back:  Muscle strength b/l lower legs 5/5.  No decreased sensation right thigh.   Skin: Skin is warm and dry.  Distal right great toe nail with extreme curvature and seems to have encircled nail bed and is growing around it.   Psychiatric: She has a normal mood and affect. Thought content normal.  Vitals reviewed.  Dg Lumbar Spine - 07/20/2018 CLINICAL DATA:  Low back pain with radiculopathy. Acute bilateral low back pain with right-sided sciatica. EXAM: LUMBAR SPINE - COMPLETE 4+ VIEW COMPARISON:  None.  FINDINGS: Five non rib-bearing lumbar type vertebral bodies are present. Vertebral body heights are maintained. There is some thinning of the disc space at L4-5 and L5-S1. Moderate facet degenerative changes are present at L4-5 and L5-S1 bilaterally without significant asymmetry. Atherosclerotic calcifications are present in the aorta without aneurysm. IMPRESSION: 1. Facet degenerative changes at L4-5 and L5-S1. 2. Mild thinning of the disc space at L4-5 and L5-S1. 3. No acute abnormality. Electronically Signed   By: San Morelle M.D.   On: 07/20/2018 15:25  Assessment and Plan :  1. Radiculopathy, lumbar region - Pt presents c/o con't lumbar radiculopathy. No red flags. Lumbar x-ray three months ago shows facet degenerative changes at L4-5 and L5-S1 and mild thinning of the disc space at L4-5 and L5-S1. Will try prednisone and physical therapy. RTC if no improvement, consider ortho referral.  - Ambulatory referral to Physical Therapy - predniSONE (DELTASONE) 20 MG tablet; Take 3 PO QAM x3days, 2 PO QAM x3days, 1 PO QAM x3days  Dispense: 18 tablet; Refill: 0  2. Ingrown nail of great toe of right foot - I am uncomfortable removing toe nail due to appearance during PE. Will refer to podiatry for evaluation and treatment.  - Ambulatory referral to [place], PA-C  [facility] at [place] 10/17/2018 8:12 AM  Please  note: Portions of this report may have been transcribed using dragon voice recognition software. Every effort was made to ensure accuracy; however, inadvertent computerized transcription errors may be present.

## 2018-10-28 ENCOUNTER — Ambulatory Visit (INDEPENDENT_AMBULATORY_CARE_PROVIDER_SITE_OTHER): Payer: 59 | Admitting: Podiatry

## 2018-10-28 ENCOUNTER — Encounter: Payer: Self-pay | Admitting: Podiatry

## 2018-10-28 VITALS — BP 157/96 | HR 78 | Resp 16

## 2018-10-28 DIAGNOSIS — B351 Tinea unguium: Secondary | ICD-10-CM

## 2018-10-28 DIAGNOSIS — L600 Ingrowing nail: Secondary | ICD-10-CM

## 2018-10-28 MED ORDER — TERBINAFINE HCL 250 MG PO TABS: 250.0000 mg | ORAL_TABLET | Freq: Every day | ORAL | 0 refills | Status: DC

## 2018-10-28 MED ORDER — GENTAMICIN SULFATE 0.1 % EX CREA: 1.0000 "application " | TOPICAL_CREAM | Freq: Two times a day (BID) | CUTANEOUS | 0 refills | Status: DC

## 2018-10-28 NOTE — Patient Instructions (Signed)

## 2018-10-31 NOTE — Progress Notes (Signed)
   Subjective: Patient presents today for evaluation of aching pain to the medial and lateral borders of the right hallux that began about 2 months ago. Patient is concerned for possible ingrown nail. Wearing shoes increases the pain. She has been soaking the toe, trimming the nail and wearing bedroom shoes for treatment. Patient presents today for further treatment and evaluation.   Past Medical History:  Diagnosis Date  . Abnormal Pap smear of cervix   . Allergy   . Asthma   . FH: breast cancer    mother and grandmother  . GERD (gastroesophageal reflux disease)   . H/O mammogram   . History of Graves' disease   . Hypothyroidism   . Mixed dyslipidemia 2013  . Palpitations 03/2012   cardiac consult, echocardiogram, holter monitor - Dr. Wynonia Lawman  . Routine gynecological examination 02/2012   pap negative and HPV negative  . Tobacco use disorder   . Wears contact lenses   . Wears dentures    upper    Objective:  General: Well developed, nourished, in no acute distress, alert and oriented x3   Dermatology: Skin is warm, dry and supple bilateral. Medial and lateral borders of the right hallux appear to be erythematous with evidence of an ingrowing nail. Pain on palpation noted to the borders of the nail fold. Hyperkeratotic, discolored, thickened, onychodystrophy of nails noted bilaterally. There are no open sores, lesions.  Vascular: Dorsalis Pedis artery and Posterior Tibial artery pedal pulses palpable. No lower extremity edema noted.   Neruologic: Grossly intact via light touch bilateral.  Musculoskeletal: Muscular strength within normal limits in all groups bilateral. Normal range of motion noted to all pedal and ankle joints.   Assesement: #1 Paronychia with ingrowing nail medial and lateral borders right hallux #2 Pain in toe #3 Incurvated nail #4 Onychomycosis nails 1-5 bilateral   Plan of Care:  1. Patient evaluated.  2. Discussed treatment alternatives and plan of  care. Explained nail avulsion procedure and post procedure course to patient. 3. Patient opted for permanent partial nail avulsion.  4. Prior to procedure, local anesthesia infiltration utilized using 3 ml of a 50:50 mixture of 2% plain lidocaine and 0.5% plain marcaine in a normal hallux block fashion and a betadine prep performed.  5. Partial permanent nail avulsion with chemical matrixectomy performed using [id number] applications of phenol followed by alcohol flush.  6. Light dressing applied. 7. Prescription for Gentamicin cream provided to patient.  8. Prescription for Lamisil 250 mg #90 provided to patient.  9. Return to clinic in 2 weeks.   Edrick Kins, DPM [facility]  Dr. Edrick Kins, [address]                                        [place], [place] [address]                Office [phone]  Fax [phone]

## 2018-11-13 ENCOUNTER — Ambulatory Visit: Payer: 59

## 2018-11-14 ENCOUNTER — Ambulatory Visit: Payer: 59 | Admitting: Certified Nurse Midwife

## 2018-11-15 ENCOUNTER — Ambulatory Visit (INDEPENDENT_AMBULATORY_CARE_PROVIDER_SITE_OTHER): Payer: 59 | Admitting: Podiatry

## 2018-11-15 DIAGNOSIS — L600 Ingrowing nail: Secondary | ICD-10-CM

## 2018-11-15 NOTE — Patient Instructions (Signed)

## 2018-11-16 NOTE — Progress Notes (Signed)
Follow-up appointment, recent procedure performed on 10/28/2018, removal of ingrown toenail right hallux bilateral borders.  She states that she is not having any complications, and is not having any pain at this time.  No redness, no erythema, no swelling, no drainage, no other signs of infection.  Area is healing well beginning to scab over  Discussed signs and symptoms of infection.  Verbal and written instructions with the patient.  She is to follow-up as needed with any acute symptom changes.

## 2019-01-27 ENCOUNTER — Other Ambulatory Visit: Payer: Self-pay | Admitting: Urgent Care

## 2019-01-29 NOTE — Telephone Encounter (Signed)
Please advise, pt need OV to establish care with new provider. Refiiled levothyroxine for 30 days.

## 2019-02-16 ENCOUNTER — Other Ambulatory Visit: Payer: Self-pay | Admitting: Family Medicine

## 2019-02-19 ENCOUNTER — Other Ambulatory Visit: Payer: Self-pay | Admitting: Family Medicine

## 2019-02-19 NOTE — Telephone Encounter (Signed)
Pharmacy is requesting change in Rx- sent for review of request.

## 2019-04-13 ENCOUNTER — Other Ambulatory Visit: Payer: Self-pay | Admitting: Family Medicine

## 2019-04-13 NOTE — Telephone Encounter (Signed)
Requested medication (s) are due for refill today: yes  Requested medication (s) are on the active medication list: yes  Last refill:  02/02/19  Future visit scheduled: no  Notes to clinic:  Pt needs appt to recheck TSH-    Requested Prescriptions  Pending Prescriptions Disp Refills   levothyroxine (SYNTHROID) 100 MCG tablet [Pharmacy Med Name: LEVOTHYROXINE 100 MCG TABLET] 30 tablet 0    Sig: TAKE 1 TABLET (100 MCG TOTAL) BY MOUTH DAILY BEFORE BREAKFAST.     Endocrinology:  Hypothyroid Agents Failed - 04/13/2019 10:32 AM      Failed - TSH needs to be rechecked within 3 months after an abnormal result. Refill until TSH is due.      Failed - TSH in normal range and within 360 days    TSH  Date Value Ref Range Status  07/20/2018 155.800 (H) 0.450 - 4.500 uIU/mL Final         Passed - Valid encounter within last 12 months    Recent Outpatient Visits          5 months ago Radiculopathy, lumbar region   Primary Care at [facility], Gelene Mink, PA-C   8 months ago Annual physical exam   Primary Care at [place], [place]

## 2019-04-24 ENCOUNTER — Other Ambulatory Visit: Payer: Self-pay | Admitting: Family Medicine

## 2019-04-26 ENCOUNTER — Ambulatory Visit (INDEPENDENT_AMBULATORY_CARE_PROVIDER_SITE_OTHER): Payer: 59 | Admitting: Family Medicine

## 2019-04-26 ENCOUNTER — Telehealth (INDEPENDENT_AMBULATORY_CARE_PROVIDER_SITE_OTHER): Payer: 59 | Admitting: Family Medicine

## 2019-04-26 ENCOUNTER — Encounter: Payer: Self-pay | Admitting: Family Medicine

## 2019-04-26 ENCOUNTER — Other Ambulatory Visit: Payer: Self-pay

## 2019-04-26 DIAGNOSIS — E039 Hypothyroidism, unspecified: Secondary | ICD-10-CM | POA: Diagnosis not present

## 2019-04-26 DIAGNOSIS — J3089 Other allergic rhinitis: Secondary | ICD-10-CM | POA: Diagnosis not present

## 2019-04-26 DIAGNOSIS — I10 Essential (primary) hypertension: Secondary | ICD-10-CM

## 2019-04-26 DIAGNOSIS — E782 Mixed hyperlipidemia: Secondary | ICD-10-CM | POA: Diagnosis not present

## 2019-04-26 MED ORDER — FLUTICASONE PROPIONATE 50 MCG/ACT NA SUSP: 2.0000 | Freq: Every day | NASAL | 6 refills | Status: DC

## 2019-04-26 NOTE — Patient Instructions (Addendum)
If you have lab work done today you will be contacted with your lab results within the next 2 weeks.  If you have not heard from [place] then please contact us. The fastest way to get your results is to register for My Chart.   IF you received an x-ray today, you will receive an invoice from [facility]. Please contact [facility] at [phone] with questions or concerns regarding your invoice.   IF you received labwork today, you will receive an invoice from [place]. Please contact [facility] at [phone] with questions or concerns regarding your invoice.   Our billing staff will not be able to assist you with questions regarding bills from these companies.  You will be contacted with the lab results as soon as they are available. The fastest way to get your results is to activate your My Chart account. Instructions are located on the last page of this paperwork. If you have not heard from [place] regarding the results in 2 weeks, please contact this office.        If you have lab work done today you will be contacted with your lab results within the next 2 weeks.  If you have not heard from [place] then please contact us. The fastest way to get your results is to register for My Chart.   IF you received an x-ray today, you will receive an invoice from [facility]. Please contact [facility] Radiology at [phone] with questions or concerns regarding your invoice.   IF you received labwork today, you will receive an invoice from [place]. Please contact [facility] at [phone] with questions or concerns regarding your invoice.   Our billing staff will not be able to assist you with questions regarding bills from these companies.  You will be contacted with the lab results as soon as they are available. The fastest way to get your results is to activate your My Chart account. Instructions are located on the last page of this paperwork. If you have not heard from [place]  regarding the results in 2 weeks, please contact this office.        If you have lab work done today you will be contacted with your lab results within the next 2 weeks.  If you have not heard from [place] then please contact us. The fastest way to get your results is to register for My Chart.   IF you received an x-ray today, you will receive an invoice from [facility]. Please contact [facility] Radiology at [phone] with questions or concerns regarding your invoice.   IF you received labwork today, you will receive an invoice from [place]. Please contact [facility] at [phone] with questions or concerns regarding your invoice.   Our billing staff will not be able to assist you with questions regarding bills from these companies.  You will be contacted with the lab results as soon as they are available. The fastest way to get your results is to activate your My Chart account. Instructions are located on the last page of this paperwork. If you have not heard from [place] regarding the results in 2 weeks, please contact this office.       If you have lab work done today you will be contacted with your lab results within the next 2 weeks.  If you have not heard from [place] then please contact us. The fastest way to get your results is to register for My Chart.   IF you received an  x-ray today, you will receive an invoice from [facility]. Please contact [facility] Radiology at [phone] with questions or concerns regarding your invoice.   IF you received labwork today, you will receive an invoice from [place]. Please contact [facility] at [phone] with questions or concerns regarding your invoice.   Our billing staff will not be able to assist you with questions regarding bills from these companies.  You will be contacted with the lab results as soon as they are available. The fastest way to get your results is to activate your My Chart account. Instructions are located  on the last page of this paperwork. If you have not heard from [place] regarding the results in 2 weeks, please contact this office.         If you have lab work done today you will be contacted with your lab results within the next 2 weeks.  If you have not heard from [place] then please contact us. The fastest way to get your results is to register for My Chart.   IF you received an x-ray today, you will receive an invoice from [facility] Radiology. Please contact [facility] Radiology at [phone] with questions or concerns regarding your invoice.   IF you received labwork today, you will receive an invoice from [place]. Please contact [facility] at [phone] with questions or concerns regarding your invoice.   Our billing staff will not be able to assist you with questions regarding bills from these companies.  You will be contacted with the lab results as soon as they are available. The fastest way to get your results is to activate your My Chart account. Instructions are located on the last page of this paperwork. If you have not heard from [place] regarding the results in 2 weeks, please contact this office.

## 2019-04-26 NOTE — Progress Notes (Signed)
Telemedicine Encounter- SOAP NOTE Established Patient  This telephone encounter was conducted with the patient's (or proxy's) verbal consent via audio telecommunications: yes/no: Yes Patient was instructed to have this encounter in a suitably private space; and to only have persons present to whom they give permission to participate. In addition, patient identity was confirmed by use of name plus two identifiers (DOB and address).  I discussed the limitations, risks, security and privacy concerns of performing an evaluation and management service by telephone and the availability of in person appointments. I also discussed with the patient that there may be a patient responsible charge related to this service. The patient expressed understanding and agreed to proceed.  I spent a total of TIME; 0 MIN TO 60 MIN: 25 minutes talking with the patient or their proxy.  CC: hypothyroidism, hypertension   Subjective   Megan Bullock is a 53 y.o. established patient. Telephone visit today for  HPI   Allergic Rhinitis She needs a refill of her flonase She takes it for seasonal allergies She denies fevers, chills, cough, cold, congestion She is also taking singulair She has mild intermittent asthma and states that it is well controlled  She has not had a need to use her albuterol inhaler in over a month  Hypertension Hypertension: Patient here for follow-up of elevated blood pressure. She is exercising and is adherent to low salt diet.  Blood pressure is well controlled at home. Cardiac symptoms none. Patient denies chest pain, exertional chest pressure/discomfort, irregular heart beat and near-syncope.  Cardiovascular risk factors: dyslipidemia and hypertension. Use of agents associated with hypertension: none. History of target organ damage: none.  BP Readings from Last 3 Encounters:  10/28/18 (!) 157/96  10/17/18 (!) 128/92  07/20/18 (!) 124/98   Dyslipidemia: Patient presents for  evaluation of lipids.  Compliance with treatment thus far has been excellent.  A repeat fasting lipid profile was done.  The patient does not use medications that may worsen dyslipidemias (corticosteroids, progestins, anabolic steroids, diuretics, beta-blockers, amiodarone, cyclosporine, olanzapine). The patient exercises intermittently.  The patient is not known to have coexisting coronary artery disease.   The 10-year ASCVD risk score Mikey Bussing DC Jr., et al., 2013) is: 28.4%   Values used to calculate the score:     Age: 19 years     Sex: Female     Is Non-Hispanic African American: Yes     Diabetic: No     Tobacco smoker: Yes     Systolic Blood Pressure: 500 mmHg     Is BP treated: Yes     HDL Cholesterol: 49 mg/dL     Total Cholesterol: 320 mg/dL  Hypothyroid She is out of her levothyroxine 100mg   Lab Results  Component Value Date   TSH 155.800 (H) 07/20/2018   She reports that she that she has been back on her medications but states that she feels tired all the time and is very sleepy When she does not take the thyroid meds she has puffy eyes and her skin feels very tight   Patient Active Problem List   Diagnosis Date Noted  . Essential hypertension 01/20/2016  . Cephalalgia 01/20/2016  . Anxiety state 01/20/2016  . Insomnia 06/02/2015  . Smoker 06/02/2015  . Asthma, mild intermittent 06/02/2015  . Mixed dyslipidemia 02/20/2014  . Allergic rhinitis, seasonal 06/01/2011  . FH: breast cancer   . History of Graves' disease 11/18/2008  . Hypothyroidism 11/18/2008    Past Medical History:  Diagnosis  Date  . Abnormal Pap smear of cervix   . Allergy   . Asthma   . FH: breast cancer    mother and grandmother  . GERD (gastroesophageal reflux disease)   . H/O mammogram   . History of Graves' disease   . Hypothyroidism   . Mixed dyslipidemia 2013  . Palpitations 03/2012   cardiac consult, echocardiogram, holter monitor - Dr. Wynonia Lawman  . Routine gynecological examination  02/2012   pap negative and HPV negative  . Tobacco use disorder   . Wears contact lenses   . Wears dentures    upper    Current Outpatient Medications  Medication Sig Dispense Refill  . albuterol (PROVENTIL HFA;VENTOLIN HFA) 108 (90 Base) MCG/ACT inhaler Inhale 1-2 puffs into the lungs every 6 (six) hours as needed for wheezing or shortness of breath. 18 g 5  . atorvastatin (LIPITOR) 20 MG tablet Take 1 tablet (20 mg total) by mouth daily. 90 tablet 3  . cetirizine (ZYRTEC) 10 MG tablet Take 1 tablet (10 mg total) by mouth daily. 90 tablet 3  . cyclobenzaprine (FLEXERIL) 5 MG tablet Take 1 tablet (5 mg total) by mouth 3 (three) times daily as needed. 90 tablet 1  . gentamicin cream (GARAMYCIN) 0.1 % Apply 1 application topically 2 (two) times daily. 15 g 0  . levothyroxine (SYNTHROID, LEVOTHROID) 100 MCG tablet TAKE 1 TABLET (100 MCG TOTAL) BY MOUTH DAILY BEFORE BREAKFAST. 30 tablet 0  . metoprolol tartrate (LOPRESSOR) 25 MG tablet Take 1 tablet (25 mg total) by mouth 2 (two) times daily. 180 tablet 1  . montelukast (SINGULAIR) 10 MG tablet TAKE 1 TABLET BY MOUTH EVERYDAY AT BEDTIME 90 tablet 4  . fluticasone (FLONASE) 50 MCG/ACT nasal spray Place 2 sprays into both nostrils daily. 16 g 6  . predniSONE (DELTASONE) 20 MG tablet Take 3 PO QAM x3days, 2 PO QAM x3days, 1 PO QAM x3days (Patient not taking: Reported on 04/26/2019) 18 tablet 0  . terbinafine (LAMISIL) 250 MG tablet Take 1 tablet (250 mg total) by mouth daily. (Patient not taking: Reported on 04/26/2019) 90 tablet 0   No current facility-administered medications for this visit.     Allergies  Allergen Reactions  . Bactrim [Sulfamethoxazole-Trimethoprim] Itching  . Chantix [Varenicline]     Not tolerated, weird dreams if taken with Wellbutrin together  . Wellbutrin [Bupropion]     Not tolerated if taken simultaneously with Chantix    Social History   Socioeconomic History  . Marital status: Widowed    Spouse name: Not on  file  . Number of children: Not on file  . Years of education: Not on file  . Highest education level: Not on file  Occupational History  . Occupation: Research scientist (physical sciences): AT [employer]  . Financial resource strain: Not on file  . Food insecurity:    Worry: Not on file    Inability: Not on file  . Transportation needs:    Medical: Not on file    Non-medical: Not on file  Tobacco Use  . Smoking status: Current Every Day Smoker    Packs/day: 0.25    Years: 27.00    Pack years: 6.75  . Smokeless tobacco: Never Used  . Tobacco comment: Black and Mild, 10/2015  Substance and Sexual Activity  . Alcohol use: Yes    Alcohol/week: 2.0 standard drinks    Types: 2 Standard drinks or equivalent per week  . Drug use: No  .  Sexual activity: Yes    Partners: Male    Birth control/protection: None  Lifestyle  . Physical activity:    Days per week: Not on file    Minutes per session: Not on file  . Stress: Not on file  Relationships  . Social connections:    Talks on phone: Not on file    Gets together: Not on file    Attends religious service: Not on file    Active member of club or organization: Not on file    Attends meetings of clubs or organizations: Not on file    Relationship status: Not on file  . Intimate partner violence:    Fear of current or ex partner: Not on file    Emotionally abused: Not on file    Physically abused: Not on file    Forced sexual activity: Not on file  Other Topics Concern  . Not on file  Social History Narrative   Separated, lives with boyfriend and her 2 children, exercise at gym - 7 days per week, in relationship monogamous, using [employer].  Nondenominational church.      ROS Review of Systems  Constitutional: Negative for activity change, appetite change, chills and fever.  HENT: Negative for congestion, nosebleeds, trouble swallowing and voice change.   Respiratory: Negative for cough, shortness of breath and  wheezing.   Gastrointestinal: Negative for diarrhea, nausea and vomiting.  Genitourinary: Negative for difficulty urinating, dysuria, flank pain and hematuria.  Musculoskeletal: Negative for back pain, joint swelling and neck pain.  Neurological: Negative for dizziness, speech difficulty, light-headedness and numbness.  See HPI. All other review of systems negative.   Objective   Vitals as reported by the patient: There were no vitals filed for this visit.  Diagnoses and all orders for this visit:  Acquired hypothyroidism- will get labs Discussed that we will need to titrate her meds to achieve a TSH of 1-5 range -     Thyroid Function Panel (THS+T3+T4+Free); Future  Mixed hyperlipidemia- discussed lipid mgmt Discussed goals and the fact that her thyroid can impact her lipid -     Lipid panel; Future  Essential hypertension- bp to be checked today and she should call in for readings -     Comprehensive metabolic panel; Future  Seasonal allergic rhinitis due to other allergic trigger- refilled flonase  Other orders -     fluticasone (FLONASE) 50 MCG/ACT nasal spray; Place 2 sprays into both nostrils daily.     I discussed the assessment and treatment plan with the patient. The patient was provided an opportunity to ask questions and all were answered. The patient agreed with the plan and demonstrated an understanding of the instructions.   The patient was advised to call back or seek an in-person evaluation if the symptoms worsen or if the condition fails to improve as anticipated.  I provided 25 minutes of non-face-to-face time during this encounter.  Forrest Moron, MD  Primary Care at [facility]

## 2019-04-26 NOTE — Progress Notes (Signed)
CC: thyroid check and medication refills. Per pt referral done for PT (for hip pain) and colonoscopy but no one ever called her regarding these appts.  No travel outside the [place] or [place] in the past 3 weeks.  Pt needs refills on cetirizine, flexeril, synthroid, and metoprolol. Depression score: 8

## 2019-04-28 ENCOUNTER — Telehealth: Payer: Self-pay | Admitting: Family Medicine

## 2019-04-28 ENCOUNTER — Other Ambulatory Visit: Payer: Self-pay | Admitting: Family Medicine

## 2019-04-28 DIAGNOSIS — E039 Hypothyroidism, unspecified: Secondary | ICD-10-CM

## 2019-04-28 MED ORDER — MONTELUKAST SODIUM 10 MG PO TABS: ORAL_TABLET | ORAL | 3 refills | Status: DC

## 2019-04-28 MED ORDER — METOPROLOL TARTRATE 25 MG PO TABS: 25.0000 mg | ORAL_TABLET | Freq: Two times a day (BID) | ORAL | 1 refills | Status: DC

## 2019-04-28 MED ORDER — CETIRIZINE HCL 10 MG PO TABS: 10.0000 mg | ORAL_TABLET | Freq: Every day | ORAL | 3 refills | Status: DC

## 2019-04-28 MED ORDER — FLUTICASONE PROPIONATE 50 MCG/ACT NA SUSP: 2.0000 | Freq: Every day | NASAL | 6 refills | Status: DC

## 2019-04-28 MED ORDER — LEVOTHYROXINE SODIUM 112 MCG PO TABS: 112.0000 ug | ORAL_TABLET | Freq: Every day | ORAL | 1 refills | Status: DC

## 2019-04-28 MED ORDER — ATORVASTATIN CALCIUM 20 MG PO TABS: 20.0000 mg | ORAL_TABLET | Freq: Every day | ORAL | 3 refills | Status: DC

## 2019-04-28 MED ORDER — ALBUTEROL SULFATE HFA 108 (90 BASE) MCG/ACT IN AERS: 1.0000 | INHALATION_SPRAY | Freq: Four times a day (QID) | RESPIRATORY_TRACT | 2 refills | Status: DC | PRN

## 2019-04-28 NOTE — Progress Notes (Signed)
error 

## 2019-04-28 NOTE — Telephone Encounter (Signed)
Discussed lab results and patient should follow up in 3 months for repeat thyroid check. Labs have already been ordered so they can be done ahead of time.

## 2019-04-30 LAB — COMPREHENSIVE METABOLIC PANEL
ALT: 19 IU/L (ref 0–32)
AST: 14 IU/L (ref 0–40)
Albumin/Globulin Ratio: 1.9 (ref 1.2–2.2)
Albumin: 4.2 g/dL (ref 3.8–4.9)
Alkaline Phosphatase: 68 IU/L (ref 39–117)
BUN/Creatinine Ratio: 6 — ABNORMAL LOW (ref 9–23)
BUN: 7 mg/dL (ref 6–24)
Bilirubin Total: 0.2 mg/dL (ref 0.0–1.2)
CO2: 21 mmol/L (ref 20–29)
Calcium: 9.3 mg/dL (ref 8.7–10.2)
Chloride: 105 mmol/L (ref 96–106)
Creatinine, Ser: 1.12 mg/dL — ABNORMAL HIGH (ref 0.57–1.00)
GFR calc Af Amer: 65 mL/min/{1.73_m2} (ref >59)
GFR calc non Af Amer: 56 mL/min/{1.73_m2} — ABNORMAL LOW (ref >59)
Globulin, Total: 2.2 g/dL (ref 1.5–4.5)
Glucose: 79 mg/dL (ref 65–99)
Potassium: 3.9 mmol/L (ref 3.5–5.2)
Sodium: 140 mmol/L (ref 134–144)
Total Protein: 6.4 g/dL (ref 6.0–8.5)

## 2019-04-30 LAB — LIPID PANEL
Chol/HDL Ratio: 3.4 ratio (ref 0.0–4.4)
Cholesterol, Total: 133 mg/dL (ref 100–199)
HDL: 39 mg/dL — ABNORMAL LOW (ref >39)
LDL Calculated: 74 mg/dL (ref 0–99)
Triglycerides: 99 mg/dL (ref 0–149)
VLDL Cholesterol Cal: 20 mg/dL (ref 5–40)

## 2019-04-30 LAB — TSH+T3+FREE T4+T3 FREE
Free T-3: 1.5 pg/mL — ABNORMAL LOW
Free T4 by Dialysis: 0.5 ng/dL — ABNORMAL LOW
TSH: 79 uU/mL — ABNORMAL HIGH
Triiodothyronine (T-3), Serum: 65 ng/dL

## 2019-05-02 ENCOUNTER — Telehealth: Payer: Self-pay | Admitting: Obstetrics and Gynecology

## 2019-05-02 NOTE — Telephone Encounter (Signed)
Patient has been having pain in her right breast for a week. States she did start her cycle the other day, unsure if it is related to that or not. Does have a history of breast cancer.

## 2019-05-02 NOTE — Telephone Encounter (Signed)
Spoke with patient. Reports burning pain on right side of right breast that started approximately 1 wk ago. Denies lumps, nipple d/c, redness, skin changes, fever/chills. No recent injuries. Family Hx of breast cancer. Last MMG 07/22/18 at [place], negative. Last AEX 11/03/17 with Melvia Heaps, CNM. Advised OV needed for further evaluation. OV scheduled for 05/08/19 at 1pm with Dr. Quincy Simmonds. Patient declined earlier office visits offered.   Routing to provider for final review. Patient is agreeable to disposition. Will close encounter.

## 2019-05-07 ENCOUNTER — Other Ambulatory Visit: Payer: Self-pay

## 2019-05-08 ENCOUNTER — Ambulatory Visit (INDEPENDENT_AMBULATORY_CARE_PROVIDER_SITE_OTHER): Payer: 59 | Admitting: Obstetrics and Gynecology

## 2019-05-08 ENCOUNTER — Telehealth: Payer: Self-pay

## 2019-05-08 ENCOUNTER — Telehealth: Payer: Self-pay | Admitting: Licensed Clinical Social Worker

## 2019-05-08 ENCOUNTER — Encounter: Payer: Self-pay | Admitting: Obstetrics and Gynecology

## 2019-05-08 VITALS — BP 130/86 | Temp 97.4°F | Wt 189.6 lb

## 2019-05-08 DIAGNOSIS — N644 Mastodynia: Secondary | ICD-10-CM

## 2019-05-08 DIAGNOSIS — Z803 Family history of malignant neoplasm of breast: Secondary | ICD-10-CM | POA: Diagnosis not present
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
## 2019-05-08 NOTE — Telephone Encounter (Signed)
Spoke with [facility]. Right diagnostic mammogram and ultrasound scheduled for 05/24/2019 at 9:15 am at [place]. This is the earliest available appointment. Placed in mammogram hold. Patient will need to wear a mask to her appointment. Referral placed to [facility] genetics. Patient will be contacted directly to schedule appointment. Spoke with patient who is agreeable to appointment date, time, and referral information.  Routing to provider and will close encounter.

## 2019-05-08 NOTE — Telephone Encounter (Signed)
A genetic counseling appt has been scheduled for the pt to see Faith Rogue on 6/23 at 1pm

## 2019-05-08 NOTE — Telephone Encounter (Signed)
-----   Message from Nunzio Cobbs, MD sent at 05/08/2019  1:58 PM EDT ----- Regarding: please schedule dx mammogram and referral to genetics counseling Please schedule a right breast mammogram and right breast US for right breast pain at 10:00. Normal physical exam.  She also needs a referral to genetics counseling for FH breast cancer.   Patient has left the office and is expecting a call back.  Thanks,  [employer]

## 2019-05-14 ENCOUNTER — Telehealth: Payer: Self-pay | Admitting: Family Medicine

## 2019-05-14 NOTE — Telephone Encounter (Signed)
Pt was denied the medication Ventolin HFA by her insurance company. Formulary alternatives are: albuterol sulfate CFC-free aerosol, levalbuterol tartrate CFC-free aerosol.

## 2019-05-20 ENCOUNTER — Other Ambulatory Visit: Payer: Self-pay | Admitting: Family Medicine

## 2019-05-21 MED ORDER — ALBUTEROL SULFATE HFA 108 (90 BASE) MCG/ACT IN AERS: 1.0000 | INHALATION_SPRAY | Freq: Four times a day (QID) | RESPIRATORY_TRACT | 2 refills | Status: DC | PRN

## 2019-05-21 NOTE — Telephone Encounter (Signed)
Please notify the patient that she should try to either use a coupon from goodrx.com to see if the ventolin can be cheaper or try another equivalent.

## 2019-05-22 NOTE — Telephone Encounter (Signed)
Spoke with pt advised per stallings she should try to either use a coupon from [employer]rx[employer] to see if the ventoolin can be cheaper or try another equivalent.  Pt agreeable. Dgaddy, CMA

## 2019-05-24 ENCOUNTER — Encounter: Payer: Self-pay | Admitting: Obstetrics and Gynecology

## 2019-06-06 ENCOUNTER — Encounter: Payer: Self-pay | Admitting: Licensed Clinical Social Worker

## 2019-06-13 ENCOUNTER — Other Ambulatory Visit: Payer: Self-pay

## 2019-06-13 ENCOUNTER — Ambulatory Visit (INDEPENDENT_AMBULATORY_CARE_PROVIDER_SITE_OTHER): Payer: 59 | Admitting: Podiatry

## 2019-06-13 ENCOUNTER — Encounter: Payer: Self-pay | Admitting: Podiatry

## 2019-06-13 VITALS — Temp 97.9°F

## 2019-06-13 DIAGNOSIS — B351 Tinea unguium: Secondary | ICD-10-CM

## 2019-06-13 MED ORDER — TERBINAFINE HCL 250 MG PO TABS: 250.0000 mg | ORAL_TABLET | Freq: Every day | ORAL | 0 refills | Status: DC

## 2019-06-17 NOTE — Progress Notes (Signed)
   Subjective: 53 y.o. female presenting today for follow up evaluation of onychomycosis. She reports discoloration of nails 1 and 2 of the right foot. She was prescribed Lamisil at her last visit which she reports not taking. She has not done anything for treatment. There are no modifying factors noted. Patient is here for further evaluation and treatment.   Past Medical History:  Diagnosis Date  . Abnormal Pap smear of cervix   . Allergy   . Asthma   . FH: breast cancer    mother and grandmother  . GERD (gastroesophageal reflux disease)   . H/O mammogram   . History of Graves' disease   . Hypothyroidism   . Mixed dyslipidemia 2013  . Palpitations 03/2012   cardiac consult, echocardiogram, holter monitor - Dr. Wynonia Lawman  . Routine gynecological examination 02/2012   pap negative and HPV negative  . Tobacco use disorder   . Wears contact lenses   . Wears dentures    upper    Objective: Physical Exam General: The patient is alert and oriented x3 in no acute distress.  Dermatology: Hyperkeratotic, discolored, thickened, onychodystrophy of nails noted 1 and 2 of the right foot. Skin is warm, dry and supple bilateral lower extremities. Negative for open lesions or macerations.  Vascular: Palpable pedal pulses bilaterally. No edema or erythema noted. Capillary refill within normal limits.  Neurological: Epicritic and protective threshold grossly intact bilaterally.   Musculoskeletal Exam: Range of motion within normal limits to all pedal and ankle joints bilateral. Muscle strength 5/5 in all groups bilateral.   Assessment: #1 Onychomycosis nails 1 and 2 right #2 Hyperkeratotic nails 1-2 right   Plan of Care:  #1 Patient was evaluated. #2 Patient never took Lamisil prescribed at last visit.  #3 Prescription for Lamisil 250 mg #90 provided to patient.  #4 Return to clinic as needed.   Edrick Kins, DPM [facility]  Dr. Edrick Kins, [address]                                        [place], [place] [address]                Office [phone]  Fax [phone]

## 2019-06-18 ENCOUNTER — Telehealth: Payer: Self-pay | Admitting: Licensed Clinical Social Worker

## 2019-06-18 NOTE — Telephone Encounter (Signed)
Called patient regarding upcoming [facility] appointment, left a voicemail and due to no communication to set up virtual visit this will be a walk-in visit.

## 2019-06-18 NOTE — Telephone Encounter (Signed)
I was unable to reach patient because there was no voicemail to verify telephone visit for pre reg

## 2019-06-19 ENCOUNTER — Encounter: Payer: Self-pay | Admitting: Licensed Clinical Social Worker

## 2019-06-19 ENCOUNTER — Inpatient Hospital Stay: Payer: 59 | Attending: Genetic Counselor | Admitting: Licensed Clinical Social Worker

## 2019-06-19 ENCOUNTER — Inpatient Hospital Stay: Payer: 59

## 2019-06-19 DIAGNOSIS — Z170 Estrogen receptor positive status [ER+]: Secondary | ICD-10-CM | POA: Diagnosis not present

## 2019-06-19 DIAGNOSIS — Z79810 Long term (current) use of selective estrogen receptor modulators (SERMs): Secondary | ICD-10-CM | POA: Diagnosis not present

## 2019-06-19 DIAGNOSIS — C50511 Malignant neoplasm of lower-outer quadrant of right female breast: Secondary | ICD-10-CM | POA: Diagnosis not present

## 2019-06-19 DIAGNOSIS — M810 Age-related osteoporosis without current pathological fracture: Secondary | ICD-10-CM | POA: Diagnosis not present

## 2019-06-19 DIAGNOSIS — Z803 Family history of malignant neoplasm of breast: Secondary | ICD-10-CM | POA: Insufficient documentation

## 2019-06-19 DIAGNOSIS — D509 Iron deficiency anemia, unspecified: Secondary | ICD-10-CM
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
## 2019-08-01 ENCOUNTER — Telehealth: Payer: Self-pay | Admitting: Licensed Clinical Social Worker

## 2019-08-01 NOTE — Telephone Encounter (Signed)
Let patient know that the lab called and her sample failed. Offered to send her another saliva kit, or to have her come in for a blood draw. She preferred to have another saliva kit sent. We will be in touch once results are back.

## 2019-08-21 ENCOUNTER — Other Ambulatory Visit: Payer: Self-pay | Admitting: Family Medicine

## 2019-08-21 NOTE — Telephone Encounter (Signed)
Requested medication (s) are due for refill today: yes  Requested medication (s) are on the active medication list: yes  Last refill: 04/28/2019  Future visit scheduled: no  Notes to clinic: review for refill   Requested Prescriptions  Pending Prescriptions Disp Refills   levothyroxine (SYNTHROID) 100 MCG tablet [Pharmacy Med Name: LEVOTHYROXINE 100 MCG TABLET] 30 tablet 0    Sig: TAKE 1 TABLET (100 MCG TOTAL) BY MOUTH DAILY BEFORE BREAKFAST.     Endocrinology:  Hypothyroid Agents Failed - 08/21/2019  9:49 AM      Failed - TSH needs to be rechecked within 3 months after an abnormal result. Refill until TSH is due.      Failed - TSH in normal range and within 360 days    TSH  Date Value Ref Range Status  07/20/2018 155.800 (H) 0.450 - 4.500 uIU/mL Final         Passed - Valid encounter within last 12 months    Recent Outpatient Visits          3 months ago Acquired hypothyroidism   [facility], Lilia Argue, MD   10 months ago Radiculopathy, lumbar region   [facility] at [facility], Gelene Mink, PA-C   1 year ago Annual physical exam   [facility] at [place], [place]

## 2019-08-22 NOTE — Telephone Encounter (Signed)
Patient's thyroid was very abnormal on the last check Lab Results  Component Value Date   TSH 155.800 (H) 07/20/2018   Component     Latest Ref Rng & Units 04/26/2019  TSH     uU/mL 79 (H)  Triiodothyronine (T-3), Serum     ng/dL 65  Free T-3     pg/mL 1.5 (L)  Free T4 by Dialysis     ng/dL 0.50 (L)    Her last LEVOTHYROXINE was increased.  I would like her to confirm if she was taking the increased dose.  She needs to come in for her repeat TSH so that her dose can be adjusted. The labs are already ordered. She just needs a lab visit.    If she is out of her medication let me know so I can send it in before her labs are rechecked.

## 2019-08-29 ENCOUNTER — Other Ambulatory Visit: Payer: Self-pay

## 2019-08-29 ENCOUNTER — Encounter (HOSPITAL_COMMUNITY): Payer: Self-pay | Admitting: Emergency Medicine

## 2019-08-29 DIAGNOSIS — R42 Dizziness and giddiness: Secondary | ICD-10-CM | POA: Diagnosis present

## 2019-08-29 DIAGNOSIS — Z5321 Procedure and treatment not carried out due to patient leaving prior to being seen by health care provider: Secondary | ICD-10-CM | POA: Insufficient documentation

## 2019-08-29 LAB — I-STAT BETA HCG BLOOD, ED (MC, WL, AP ONLY): I-stat hCG, quantitative: <5 m[IU]/mL (ref <5)

## 2019-08-29 LAB — CBG MONITORING, ED: Glucose-Capillary: 118 mg/dL — ABNORMAL HIGH (ref 70–99)

## 2019-08-29 MED ORDER — SODIUM CHLORIDE 0.9% FLUSH: 3.0000 mL | Freq: Once | INTRAVENOUS | Status: DC

## 2019-08-29 NOTE — ED Triage Notes (Signed)
Patient here from home with complaints of dizziness that started today. Denis n/v.

## 2019-08-30 ENCOUNTER — Telehealth: Payer: Self-pay | Admitting: Family Medicine

## 2019-08-30 ENCOUNTER — Emergency Department (HOSPITAL_COMMUNITY)
Admission: EM | Admit: 2019-08-30 | Discharge: 2019-08-30 | Discharge status: Against Medical Advice | Payer: 59 | Attending: Emergency Medicine | Admitting: Emergency Medicine

## 2019-08-30 LAB — BASIC METABOLIC PANEL
Anion gap: 7 (ref 5–15)
BUN: 10 mg/dL (ref 6–20)
CO2: 27 mmol/L (ref 22–32)
Calcium: 9.6 mg/dL (ref 8.9–10.3)
Chloride: 109 mmol/L (ref 98–111)
Creatinine, Ser: 1.29 mg/dL — ABNORMAL HIGH (ref 0.44–1.00)
GFR calc Af Amer: 55 mL/min — ABNORMAL LOW (ref >60)
GFR calc non Af Amer: 47 mL/min — ABNORMAL LOW (ref >60)
Glucose, Bld: 117 mg/dL — ABNORMAL HIGH (ref 70–99)
Potassium: 3.5 mmol/L (ref 3.5–5.1)
Sodium: 143 mmol/L (ref 135–145)

## 2019-08-30 LAB — CBC
HCT: 37.8 % (ref 36.0–46.0)
Hemoglobin: 12.1 g/dL (ref 12.0–15.0)
MCH: 30.9 pg (ref 26.0–34.0)
MCHC: 32 g/dL (ref 30.0–36.0)
MCV: 96.4 fL (ref 80.0–100.0)
Platelets: 161 10*3/uL (ref 150–400)
RBC: 3.92 MIL/uL (ref 3.87–5.11)
RDW: 14.7 % (ref 11.5–15.5)
WBC: 7.4 10*3/uL (ref 4.0–10.5)
nRBC: 0 % (ref 0.0–0.2)

## 2019-08-30 NOTE — Telephone Encounter (Signed)
Pt went er last night [place] wants someone to call her regarding lab results.Marland Kitchen also needs thyroid meds. I will try to schedule fr

## 2019-08-31 NOTE — Telephone Encounter (Signed)
Will discuss labs at [facility] with provider.

## 2019-09-11 IMAGING — DX DG LUMBAR SPINE COMPLETE 4+V
5 series · 5 of 5 positions shown · non-contrast
Comparison: None.

CLINICAL DATA: Low back pain with radiculopathy. Acute bilateral
low back pain with right-sided sciatica.

EXAM:
LUMBAR SPINE - COMPLETE 4+ VIEW

[l-spine ap]
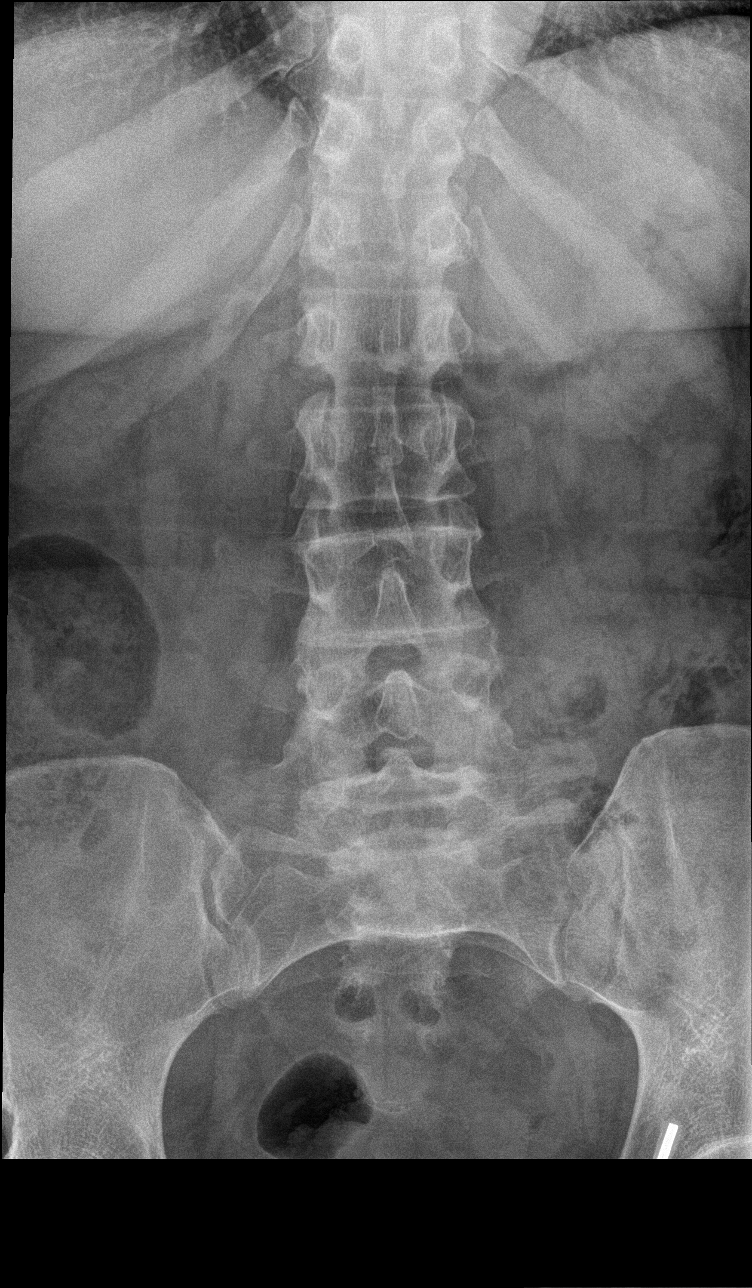

[l-spine obl (1 of 2)]
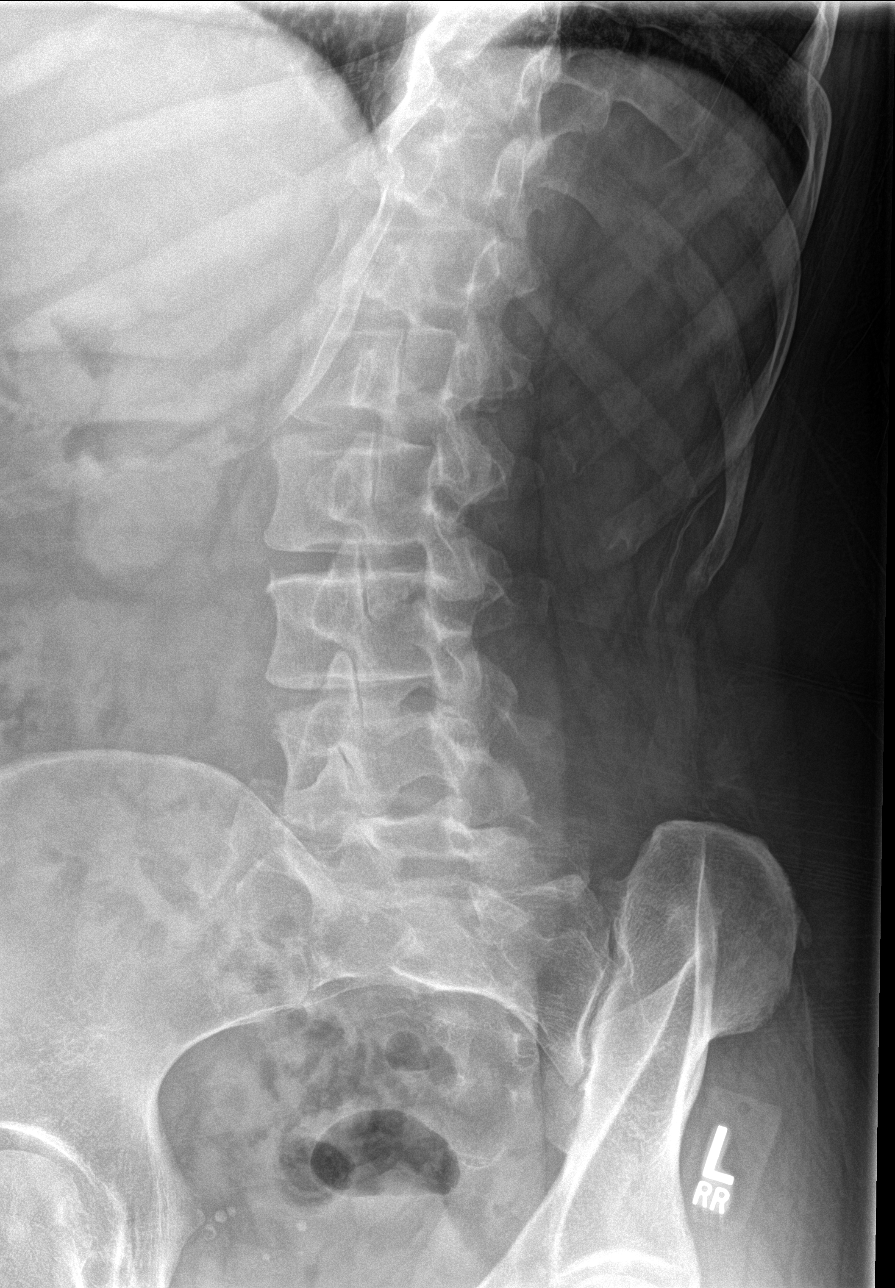

[l-spine obl (2 of 2)]
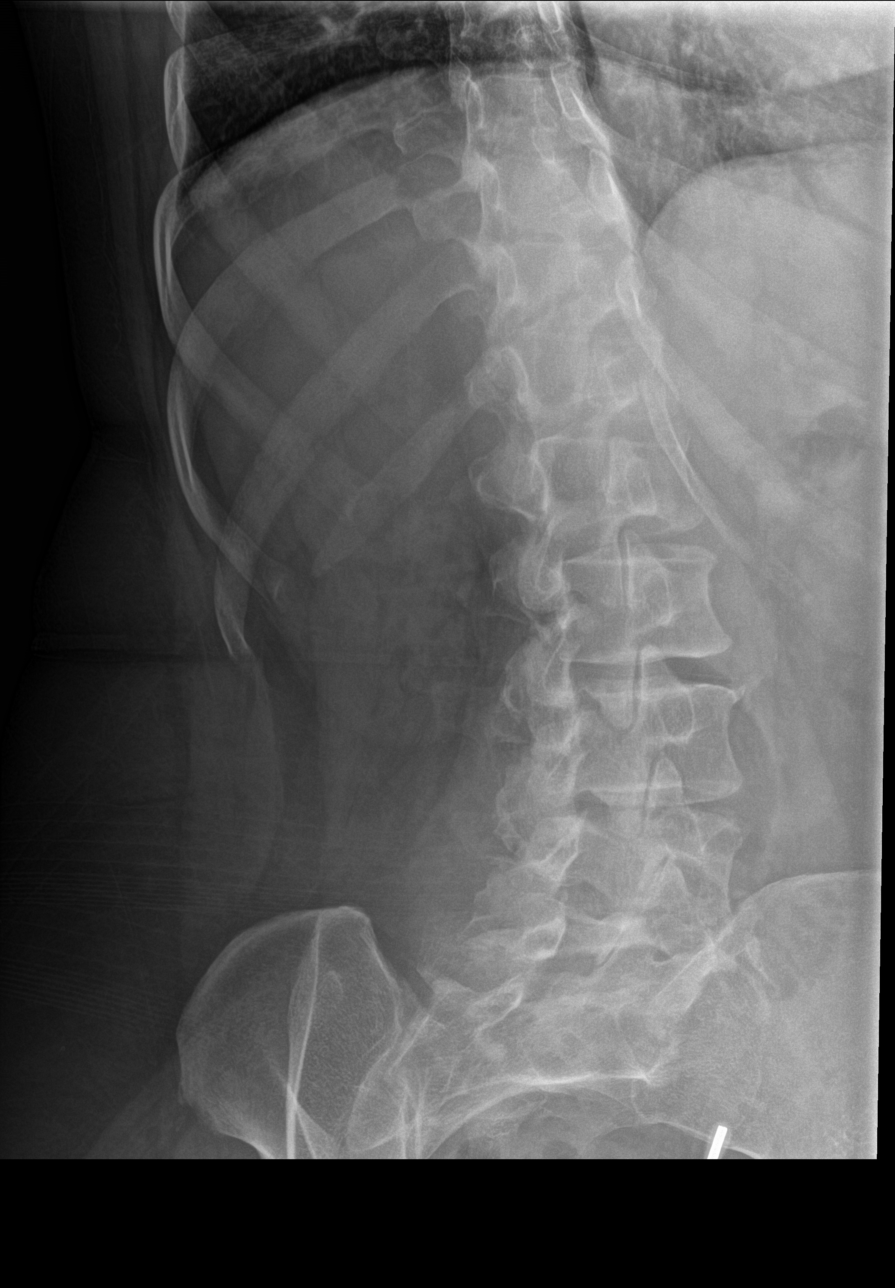

[l-spine lat]
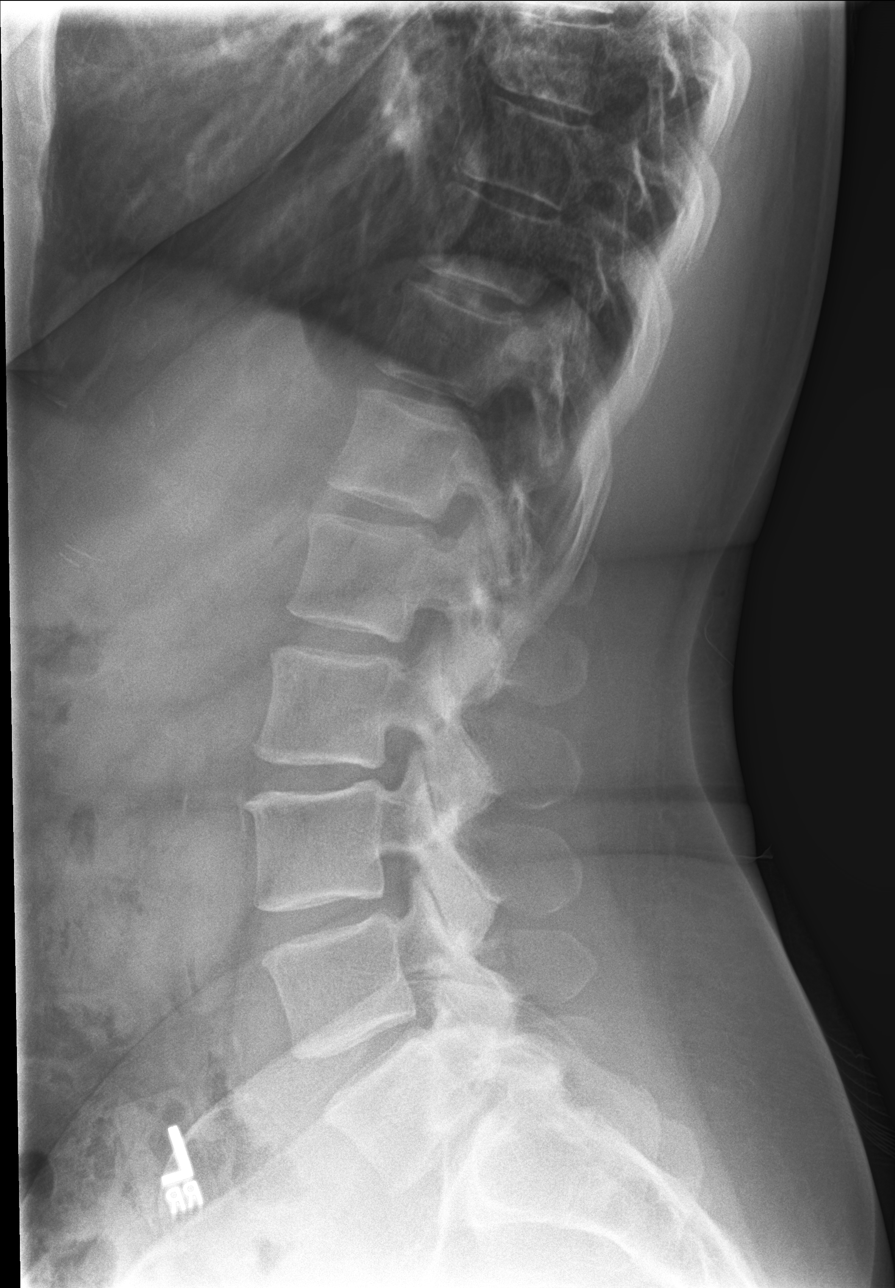

[l-spine l5-s1]
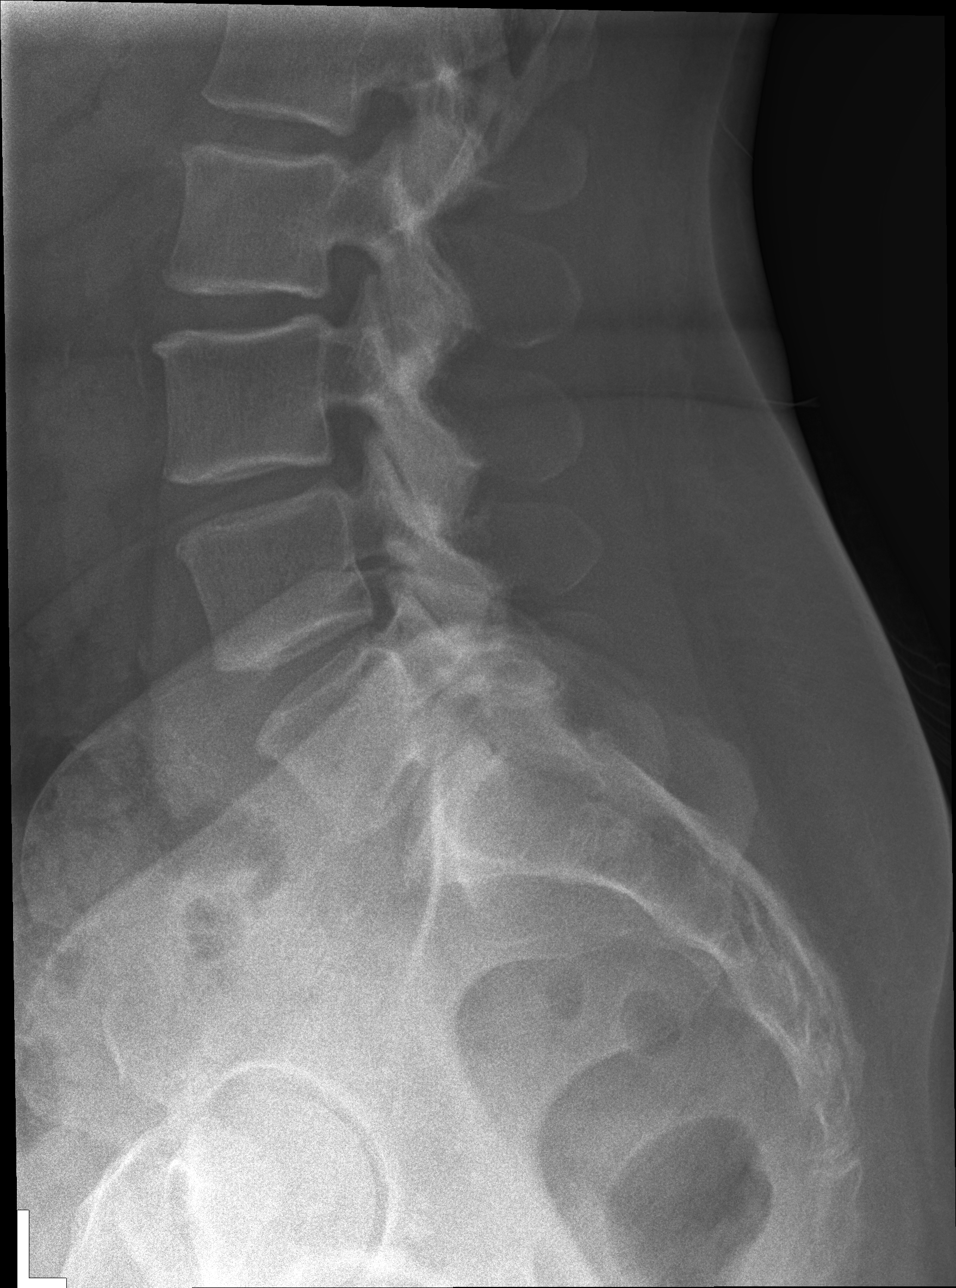

[5 of 5 positions shown; findings below may reference images not displayed]

FINDINGS: Five non rib-bearing lumbar type vertebral bodies are present.
Vertebral body heights are maintained. There is some thinning of the
disc space at L4-5 and L5-S1. Moderate facet degenerative changes
are present at L4-5 and L5-S1 bilaterally without significant
asymmetry. Atherosclerotic calcifications are present in the aorta
without aneurysm.
IMPRESSION: 1. Facet degenerative changes at L4-5 and L5-S1.
2. Mild thinning of the disc space at L4-5 and L5-S1.
3. No acute abnormality.

## 2019-09-21 ENCOUNTER — Ambulatory Visit (INDEPENDENT_AMBULATORY_CARE_PROVIDER_SITE_OTHER): Payer: 59 | Admitting: Family Medicine

## 2019-09-21 ENCOUNTER — Other Ambulatory Visit: Payer: Self-pay

## 2019-09-21 VITALS — BP 153/83 | HR 70 | Temp 98.4°F | Wt 190.5 lb

## 2019-09-21 DIAGNOSIS — E782 Mixed hyperlipidemia: Secondary | ICD-10-CM | POA: Diagnosis not present

## 2019-09-21 DIAGNOSIS — R739 Hyperglycemia, unspecified: Secondary | ICD-10-CM

## 2019-09-21 DIAGNOSIS — E039 Hypothyroidism, unspecified: Secondary | ICD-10-CM | POA: Diagnosis not present

## 2019-09-21 DIAGNOSIS — M5431 Sciatica, right side: Secondary | ICD-10-CM

## 2019-09-21 DIAGNOSIS — J4520 Mild intermittent asthma, uncomplicated: Secondary | ICD-10-CM

## 2019-09-21 DIAGNOSIS — M5416 Radiculopathy, lumbar region: Secondary | ICD-10-CM

## 2019-09-21 DIAGNOSIS — I10 Essential (primary) hypertension: Secondary | ICD-10-CM

## 2019-09-21 DIAGNOSIS — J3089 Other allergic rhinitis: Secondary | ICD-10-CM | POA: Diagnosis not present

## 2019-09-21 MED ORDER — METOPROLOL SUCCINATE ER 50 MG PO TB24: 50.0000 mg | ORAL_TABLET | Freq: Every day | ORAL | 1 refills | Status: DC

## 2019-09-21 MED ORDER — LEVOTHYROXINE SODIUM 112 MCG PO TABS: 112.0000 ug | ORAL_TABLET | Freq: Every day | ORAL | 1 refills | Status: DC

## 2019-09-21 MED ORDER — CYCLOBENZAPRINE HCL 5 MG PO TABS: 5.0000 mg | ORAL_TABLET | Freq: Three times a day (TID) | ORAL | 0 refills | Status: DC | PRN
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
## 2019-09-21 NOTE — Patient Instructions (Addendum)
Change blood pressure med to once daily form.  Keep a record of your blood pressures outside of the office and bring them to the next office visit. No change in thyroid med for now until I see labs.  I will check some labs, but follow up to discuss urination further in next few weeks.  Try tylenol for back pain, flexeril if needed for now, but will also refer you to physical therapy for sciatica.   Return to the clinic or go to the nearest emergency room if any of your symptoms worsen or new symptoms occur.  Sciatica  Sciatica is pain, numbness, weakness, or tingling along the path of the sciatic nerve. The sciatic nerve starts in the lower back and runs down the back of each leg. The nerve controls the muscles in the lower leg and in the back of the knee. It also provides feeling (sensation) to the back of the thigh, the lower leg, and the sole of the foot. Sciatica is a symptom of another medical condition that pinches or puts pressure on the sciatic nerve. Sciatica most often only affects one side of the body. Sciatica usually goes away on its own or with treatment. In some cases, sciatica may come back (recur). What are the causes? This condition is caused by pressure on the sciatic nerve or pinching of the nerve. This may be the result of:  A disk in between the bones of the spine bulging out too far (herniated disk).  Age-related changes in the spinal disks.  A pain disorder that affects a muscle in the buttock.  Extra bone growth near the sciatic nerve.  A break (fracture) of the pelvis.  Pregnancy.  Tumor. This is rare. What increases the risk? The following factors may make you more likely to develop this condition:  Playing sports that place pressure or stress on the spine.  Having poor strength and flexibility.  A history of back injury or surgery.  Sitting for long periods of time.  Doing activities that involve repetitive bending or lifting.  Obesity. What are  the signs or symptoms? Symptoms can vary from mild to very severe, and they may include:  Any of these problems in the lower back, leg, hip, or buttock: ? Mild tingling, numbness, or dull aches. ? Burning sensations. ? Sharp pains.  Numbness in the back of the calf or the sole of the foot.  Leg weakness.  Severe back pain that makes movement difficult. Symptoms may get worse when you cough, sneeze, or laugh, or when you sit or stand for long periods of time. How is this diagnosed? This condition may be diagnosed based on:  Your symptoms and medical history.  A physical exam.  Blood tests.  Imaging tests, such as: ? X-rays. ? MRI. ? CT scan. How is this treated? In many cases, this condition improves on its own without treatment. However, treatment may include:  Reducing or modifying physical activity.  Exercising and stretching.  Icing and applying heat to the affected area.  Medicines that help to: ? Relieve pain and swelling. ? Relax your muscles.  Injections of medicines that help to relieve pain, irritation, and inflammation around the sciatic nerve (steroids).  Surgery. Follow these instructions at home: Medicines  Take over-the-counter and prescription medicines only as told by your health care provider.  Ask your health care provider if the medicine prescribed to you: ? Requires you to avoid driving or using heavy machinery. ? Can cause constipation. You may  need to take these actions to prevent or treat constipation:  Drink enough fluid to keep your urine pale yellow.  Take over-the-counter or prescription medicines.  Eat foods that are high in fiber, such as beans, whole grains, and fresh fruits and vegetables.  Limit foods that are high in fat and processed sugars, such as fried or sweet foods. Managing pain      If directed, put ice on the affected area. ? Put ice in a plastic bag. ? Place a towel between your skin and the bag. ? Leave the  ice on for 20 minutes, 2-3 times a day.  If directed, apply heat to the affected area. Use the heat source that your health care provider recommends, such as a moist heat pack or a heating pad. ? Place a towel between your skin and the heat source. ? Leave the heat on for 20-30 minutes. ? Remove the heat if your skin turns bright red. This is especially important if you are unable to feel pain, heat, or cold. You may have a greater risk of getting burned. Activity   Return to your normal activities as told by your health care provider. Ask your health care provider what activities are safe for you.  Avoid activities that make your symptoms worse.  Take brief periods of rest throughout the day. ? When you rest for longer periods, mix in some mild activity or stretching between periods of rest. This will help to prevent stiffness and pain. ? Avoid sitting for long periods of time without moving. Get up and move around at least one time each hour.  Exercise and stretch regularly, as told by your health care provider.  Do not lift anything that is heavier than 10 lb (4.5 kg) while you have symptoms of sciatica. When you do not have symptoms, you should still avoid heavy lifting, especially repetitive heavy lifting.  When you lift objects, always use proper lifting technique, which includes: ? Bending your knees. ? Keeping the load close to your body. ? Avoiding twisting. General instructions  Maintain a healthy weight. Excess weight puts extra stress on your back.  Wear supportive, comfortable shoes. Avoid wearing high heels.  Avoid sleeping on a mattress that is too soft or too hard. A mattress that is firm enough to support your back when you sleep may help to reduce your pain.  Keep all follow-up visits as told by your health care provider. This is important. Contact a health care provider if:  You have pain that: ? Wakes you up when you are sleeping. ? Gets worse when you lie  down. ? Is worse than you have experienced in the past. ? Lasts longer than 4 weeks.  You have an unexplained weight loss. Get help right away if:  You are not able to control when you urinate or have bowel movements (incontinence).  You have: ? Weakness in your lower back, pelvis, buttocks, or legs that gets worse. ? Redness or swelling of your back. ? A burning sensation when you urinate. Summary  Sciatica is pain, numbness, weakness, or tingling along the path of the sciatic nerve.  This condition is caused by pressure on the sciatic nerve or pinching of the nerve.  Sciatica can cause pain, numbness, or tingling in the lower back, legs, hips, and buttocks.  Treatment often includes rest, exercise, medicines, and applying ice or heat. This information is not intended to replace advice given to you by your health care provider. Make sure  you discuss any questions you have with your health care provider. Document Released: 12/07/2001 Document Revised: 01/01/2019 Document Reviewed: 01/01/2019 Elsevier Patient Education  [employer].    How to Take Your Blood Pressure You can take your blood pressure at home with a machine. You may need to check your blood pressure at home:  To check if you have high blood pressure (hypertension).  To check your blood pressure over time.  To make sure your blood pressure medicine is working. Supplies needed: You will need a blood pressure machine, or monitor. You can buy one at a drugstore or online. When choosing one:  Choose one with an arm cuff.  Choose one that wraps around your upper arm. Only one finger should fit between your arm and the cuff.  Do not choose one that measures your blood pressure from your wrist or finger. Your doctor can suggest a monitor. How to prepare Avoid these things for 30 minutes before checking your blood pressure:  Drinking caffeine.  Drinking alcohol.  Eating.  Smoking.  Exercising. Five  minutes before checking your blood pressure:  Pee.  Sit in a dining chair. Avoid sitting in a soft couch or armchair.  Be quiet. Do not talk. How to take your blood pressure Follow the instructions that came with your machine. If you have a digital blood pressure monitor, these may be the instructions: 1. Sit up straight. 2. Place your feet on the floor. Do not cross your ankles or legs. 3. Rest your left arm at the level of your heart. You may rest it on a table, desk, or chair. 4. Pull up your shirt sleeve. 5. Wrap the blood pressure cuff around the upper part of your left arm. The cuff should be 1 inch (2.5 cm) above your elbow. It is best to wrap the cuff around bare skin. 6. Fit the cuff snugly around your arm. You should be able to place only one finger between the cuff and your arm. 7. Put the cord inside the groove of your elbow. 8. Press the power button. 9. Sit quietly while the cuff fills with air and loses air. 10. Write down the numbers on the screen. 11. Wait 2-3 minutes and then repeat steps 1-10. What do the numbers mean? Two numbers make up your blood pressure. The first number is called systolic pressure. The second is called diastolic pressure. An example of a blood pressure reading is "120 over 80" (or 120/80). If you are an adult and do not have a medical condition, use this guide to find out if your blood pressure is normal: Normal  First number: below 120.  Second number: below 80. Elevated  First number: 120-129.  Second number: below 80. Hypertension stage 1  First number: 130-139.  Second number: 80-89. Hypertension stage 2  First number: 140 or above.  Second number: 41 or above. Your blood pressure is above normal even if only the top or bottom number is above normal. Follow these instructions at home:  Check your blood pressure as often as your doctor tells you to.  Take your monitor to your next doctor's appointment. Your doctor  will: ? Make sure you are using it correctly. ? Make sure it is working right.  Make sure you understand what your blood pressure numbers should be.  Tell your doctor if your medicines are causing side effects. Contact a doctor if:  Your blood pressure keeps being high. Get help right away if:  Your first  blood pressure number is higher than 180.  Your second blood pressure number is higher than 120. This information is not intended to replace advice given to you by your health care provider. Make sure you discuss any questions you have with your health care provider. Document Released: 11/25/2008 Document Revised: 11/25/2017 Document Reviewed: 05/21/2016 Elsevier Patient Education  [employer].   If you have lab work done today you will be contacted with your lab results within the next 2 weeks.  If you have not heard from [place] then please contact us. The fastest way to get your results is to register for My Chart.   IF you received an x-ray today, you will receive an invoice from [facility]. Please contact [facility] at [phone] with questions or concerns regarding your invoice.   IF you received labwork today, you will receive an invoice from [facility]. Please contact [facility] at [phone] with questions or concerns regarding your invoice.   Our billing staff will not be able to assist you with questions regarding bills from these companies.  You will be contacted with the lab results as soon as they are available. The fastest way to get your results is to activate your My Chart account. Instructions are located on the last page of this paperwork. If you have not heard from [place] regarding the results in 2 weeks, please contact this office.

## 2019-09-22 ENCOUNTER — Encounter: Payer: Self-pay | Admitting: Family Medicine

## 2019-09-22 LAB — LIPID PANEL
Chol/HDL Ratio: 3.1 ratio (ref 0.0–4.4)
Cholesterol, Total: 131 mg/dL (ref 100–199)
HDL: 42 mg/dL (ref >39)
LDL Chol Calc (NIH): 68 mg/dL (ref 0–99)
Triglycerides: 114 mg/dL (ref 0–149)
VLDL Cholesterol Cal: 21 mg/dL (ref 5–40)

## 2019-09-22 LAB — COMPREHENSIVE METABOLIC PANEL
ALT: 11 IU/L (ref 0–32)
AST: 14 IU/L (ref 0–40)
Albumin/Globulin Ratio: 1.8 (ref 1.2–2.2)
Albumin: 4.4 g/dL (ref 3.8–4.9)
Alkaline Phosphatase: 80 IU/L (ref 39–117)
BUN/Creatinine Ratio: 12 (ref 9–23)
BUN: 12 mg/dL (ref 6–24)
Bilirubin Total: <0.2 mg/dL (ref 0.0–1.2)
CO2: 20 mmol/L (ref 20–29)
Calcium: 9.3 mg/dL (ref 8.7–10.2)
Chloride: 105 mmol/L (ref 96–106)
Creatinine, Ser: 1.01 mg/dL — ABNORMAL HIGH (ref 0.57–1.00)
GFR calc Af Amer: 73 mL/min/{1.73_m2} (ref >59)
GFR calc non Af Amer: 64 mL/min/{1.73_m2} (ref >59)
Globulin, Total: 2.4 g/dL (ref 1.5–4.5)
Glucose: 100 mg/dL — ABNORMAL HIGH (ref 65–99)
Potassium: 4.4 mmol/L (ref 3.5–5.2)
Sodium: 143 mmol/L (ref 134–144)
Total Protein: 6.8 g/dL (ref 6.0–8.5)

## 2019-09-22 LAB — TSH+FREE T4
Free T4: 1.64 ng/dL (ref 0.82–1.77)
TSH: 5.7 u[IU]/mL — ABNORMAL HIGH (ref 0.450–4.500)

## 2019-09-22 LAB — HEMOGLOBIN A1C
Est. average glucose Bld gHb Est-mCnc: 114 mg/dL
Hgb A1c MFr Bld: 5.6 % (ref 4.8–5.6)

## 2019-10-10 ENCOUNTER — Encounter: Payer: Self-pay | Admitting: Physical Therapy

## 2019-10-10 ENCOUNTER — Other Ambulatory Visit: Payer: Self-pay

## 2019-10-10 ENCOUNTER — Ambulatory Visit: Payer: 59 | Attending: Family Medicine | Admitting: Physical Therapy

## 2019-10-10 DIAGNOSIS — G8929 Other chronic pain: Secondary | ICD-10-CM | POA: Diagnosis present

## 2019-10-10 DIAGNOSIS — M5416 Radiculopathy, lumbar region: Secondary | ICD-10-CM | POA: Diagnosis not present

## 2019-10-10 DIAGNOSIS — M5441 Lumbago with sciatica, right side: Secondary | ICD-10-CM | POA: Insufficient documentation
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
## 2019-10-12 ENCOUNTER — Ambulatory Visit: Payer: 59 | Admitting: Family Medicine

## 2019-10-22 ENCOUNTER — Ambulatory Visit: Payer: 59 | Admitting: Physical Therapy

## 2019-10-24 ENCOUNTER — Encounter

## 2019-10-31 ENCOUNTER — Ambulatory Visit: Payer: 59 | Attending: Family Medicine | Admitting: Physical Therapy

## 2019-10-31 DIAGNOSIS — M5441 Lumbago with sciatica, right side: Secondary | ICD-10-CM | POA: Insufficient documentation

## 2019-10-31 DIAGNOSIS — G8929 Other chronic pain: Secondary | ICD-10-CM | POA: Insufficient documentation

## 2019-10-31 DIAGNOSIS — M5416 Radiculopathy, lumbar region: Secondary | ICD-10-CM | POA: Insufficient documentation

## 2019-11-05 ENCOUNTER — Ambulatory Visit: Payer: 59 | Admitting: Physical Therapy

## 2019-11-07 ENCOUNTER — Other Ambulatory Visit: Payer: Self-pay

## 2019-11-07 ENCOUNTER — Ambulatory Visit: Payer: 59 | Admitting: Physical Therapy

## 2019-11-07 ENCOUNTER — Encounter: Payer: Self-pay | Admitting: Physical Therapy

## 2019-11-07 DIAGNOSIS — M5416 Radiculopathy, lumbar region: Secondary | ICD-10-CM

## 2019-11-07 DIAGNOSIS — G8929 Other chronic pain: Secondary | ICD-10-CM | POA: Diagnosis present

## 2019-11-07 DIAGNOSIS — M5441 Lumbago with sciatica, right side: Secondary | ICD-10-CM | POA: Diagnosis present

## 2019-11-07 NOTE — Patient Instructions (Signed)
Access Code: [email or web address]  URL: [email or web address]  Date: 11/07/2019  Prepared by: Hilda Blades   Exercises Supine Single Knee to Chest Stretch - 20-30 seconds hold Supine Piriformis Stretch with Leg Straight - 20-30 seconds hold Supine Bridge - 10 reps Supine Lower Trunk Rotation - 10 reps - 5 seconds hold Beginner Clam - 10 reps Child's Pose Stretch - 20-30 seconds hold Cat-Camel - 10 reps
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
## 2019-11-12 ENCOUNTER — Ambulatory Visit: Payer: 59 | Admitting: Physical Therapy

## 2019-11-14 ENCOUNTER — Ambulatory Visit: Payer: 59 | Admitting: Physical Therapy

## 2019-11-14 ENCOUNTER — Other Ambulatory Visit: Payer: Self-pay

## 2019-11-14 ENCOUNTER — Encounter: Payer: Self-pay | Admitting: Physical Therapy

## 2019-11-14 DIAGNOSIS — M5416 Radiculopathy, lumbar region: Secondary | ICD-10-CM

## 2019-11-14 DIAGNOSIS — M5441 Lumbago with sciatica, right side: Secondary | ICD-10-CM

## 2019-11-14 DIAGNOSIS — G8929 Other chronic pain: Secondary | ICD-10-CM
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
## 2019-11-19 ENCOUNTER — Ambulatory Visit: Payer: 59 | Admitting: Physical Therapy

## 2019-11-19 ENCOUNTER — Other Ambulatory Visit: Payer: Self-pay

## 2019-11-19 ENCOUNTER — Encounter: Payer: Self-pay | Admitting: Physical Therapy

## 2019-11-19 DIAGNOSIS — M5416 Radiculopathy, lumbar region: Secondary | ICD-10-CM

## 2019-11-19 DIAGNOSIS — G8929 Other chronic pain: Secondary | ICD-10-CM
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
## 2019-11-21 ENCOUNTER — Ambulatory Visit: Payer: 59 | Admitting: Physical Therapy

## 2019-12-03 ENCOUNTER — Ambulatory Visit (INDEPENDENT_AMBULATORY_CARE_PROVIDER_SITE_OTHER): Payer: 59 | Admitting: Family Medicine

## 2019-12-03 ENCOUNTER — Other Ambulatory Visit: Payer: Self-pay

## 2019-12-03 ENCOUNTER — Encounter: Payer: Self-pay | Admitting: Family Medicine

## 2019-12-03 VITALS — BP 129/81 | HR 81 | Temp 97.3°F | Wt 193.2 lb

## 2019-12-03 DIAGNOSIS — E039 Hypothyroidism, unspecified: Secondary | ICD-10-CM

## 2019-12-03 DIAGNOSIS — M5416 Radiculopathy, lumbar region: Secondary | ICD-10-CM

## 2019-12-03 DIAGNOSIS — N951 Menopausal and female climacteric states: Secondary | ICD-10-CM

## 2019-12-03 DIAGNOSIS — N912 Amenorrhea, unspecified: Secondary | ICD-10-CM

## 2019-12-03 DIAGNOSIS — Z1211 Encounter for screening for malignant neoplasm of colon: Secondary | ICD-10-CM

## 2019-12-03 DIAGNOSIS — M5431 Sciatica, right side: Secondary | ICD-10-CM | POA: Diagnosis not present

## 2019-12-03 DIAGNOSIS — I10 Essential (primary) hypertension: Secondary | ICD-10-CM

## 2019-12-03 NOTE — Patient Instructions (Addendum)
Continue same dose of meds for now.  I will check thyroid test as well as hormones for possible menopause.  This can also be discussed with OB/GYN at your scheduled appointment.  I placed a referral for colonoscopy.  I referred you back to physical therapy as that was improving symptoms previously.  Okay to use Flexeril at times if needed.  If back pain is not improving, would recommend meeting with back specialist to look at other treatment options.  Follow-up with me in 6 weeks, sooner if worse.    If you have lab work done today you will be contacted with your lab results within the next 2 weeks.  If you have not heard from [place] then please contact us. The fastest way to get your results is to register for My Chart.   IF you received an x-ray today, you will receive an invoice from [facility]. Please contact [facility] at [phone] with questions or concerns regarding your invoice.   IF you received labwork today, you will receive an invoice from [place]. Please contact [facility] at [phone] with questions or concerns regarding your invoice.   Our billing staff will not be able to assist you with questions regarding bills from these companies.  You will be contacted with the lab results as soon as they are available. The fastest way to get your results is to activate your My Chart account. Instructions are located on the last page of this paperwork. If you have not heard from [place] regarding the results in 2 weeks, please contact this office.     Continue same dose of meds for now.
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
## 2019-12-04 LAB — FSH/LH
FSH: 20.8 m[IU]/mL
LH: 24.1 m[IU]/mL

## 2019-12-04 LAB — TSH+FREE T4
Free T4: 1.32 ng/dL (ref 0.82–1.77)
TSH: 3.06 u[IU]/mL (ref 0.450–4.500)

## 2019-12-10 ENCOUNTER — Encounter: Payer: Self-pay | Admitting: Licensed Clinical Social Worker
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
## 2019-12-30 ENCOUNTER — Other Ambulatory Visit: Payer: Self-pay | Admitting: Family Medicine

## 2020-01-16 ENCOUNTER — Encounter: Payer: Self-pay | Admitting: Family Medicine

## 2020-01-16 ENCOUNTER — Other Ambulatory Visit: Payer: Self-pay

## 2020-01-16 ENCOUNTER — Ambulatory Visit (INDEPENDENT_AMBULATORY_CARE_PROVIDER_SITE_OTHER): Payer: BC Managed Care – PPO | Admitting: Family Medicine

## 2020-01-16 VITALS — BP 138/87 | HR 86 | Temp 97.8°F | Ht 66.0 in | Wt 197.2 lb

## 2020-01-16 DIAGNOSIS — M5431 Sciatica, right side: Secondary | ICD-10-CM | POA: Diagnosis not present

## 2020-01-16 DIAGNOSIS — M5416 Radiculopathy, lumbar region: Secondary | ICD-10-CM | POA: Diagnosis not present

## 2020-01-16 MED ORDER — CYCLOBENZAPRINE HCL 5 MG PO TABS: ORAL_TABLET | ORAL | 0 refills | Status: DC

## 2020-01-16 MED ORDER — MELOXICAM 7.5 MG PO TABS: 7.5000 mg | ORAL_TABLET | Freq: Every day | ORAL | 0 refills | Status: DC

## 2020-01-16 NOTE — Patient Instructions (Addendum)
  Okay to continue Flexeril as needed at bedtime, start meloxicam as anti-inflammatory once per day.  Do not take that with Advil or Aleve or other anti-inflammatory medicines.  Okay to take Tylenol with that medication if needed.  Please schedule physical therapy follow-up.  See information below.  Recheck in 1 month.  If any return of incontinence or acute worsening symptoms be seen right away.   Return to the clinic or go to the nearest emergency room if any of your symptoms worsen or new symptoms occur.  Here is number to schedule colonoscopy: [facility] Phone: [phone]  Call PT as it appears a new referral may not be needed. I did order a new referral in December: [facility]: [phone]    If you have lab work done today you will be contacted with your lab results within the next 2 weeks.  If you have not heard from [place] then please contact us. The fastest way to get your results is to register for My Chart.   IF you received an x-ray today, you will receive an invoice from [facility]. Please contact [facility] at [phone] with questions or concerns regarding your invoice.   IF you received labwork today, you will receive an invoice from [facility]. Please contact [facility] at [phone] with questions or concerns regarding your invoice.   Our billing staff will not be able to assist you with questions regarding bills from these companies.  You will be contacted with the lab results as soon as they are available. The fastest way to get your results is to activate your My Chart account. Instructions are located on the last page of this paperwork. If you have not heard from [place] regarding the results in 2 weeks, please contact this office.
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
## 2020-01-29 ENCOUNTER — Encounter: Payer: Self-pay | Admitting: Gastroenterology

## 2020-02-12 ENCOUNTER — Other Ambulatory Visit: Payer: Self-pay | Admitting: Family Medicine

## 2020-02-16 ENCOUNTER — Other Ambulatory Visit: Payer: Self-pay | Admitting: Family Medicine

## 2020-02-16 DIAGNOSIS — M5431 Sciatica, right side: Secondary | ICD-10-CM

## 2020-02-16 DIAGNOSIS — M5416 Radiculopathy, lumbar region: Secondary | ICD-10-CM

## 2020-02-16 NOTE — Telephone Encounter (Signed)
Requested Prescriptions  Pending Prescriptions Disp Refills  . meloxicam (MOBIC) 7.5 MG tablet [Pharmacy Med Name: MELOXICAM 7.5 MG TABLET] 30 tablet 0    Sig: TAKE 1 TABLET BY MOUTH EVERY DAY     Analgesics:  COX2 Inhibitors Failed - 02/16/2020  9:40 AM      Failed - Cr in normal range and within 360 days    Creat  Date Value Ref Range Status  06/02/2015 0.85 0.50 - 1.10 mg/dL Final   Creatinine, Ser  Date Value Ref Range Status  09/21/2019 1.01 (H) 0.57 - 1.00 mg/dL Final         Passed - HGB in normal range and within 360 days    Hemoglobin  Date Value Ref Range Status  08/29/2019 12.1 12.0 - 15.0 g/dL Final  07/20/2018 13.0 11.1 - 15.9 g/dL Final   Hemoglobin, fingerstick  Date Value Ref Range Status  10/30/2015 12.6 12.0 - 16.0 g/dL Final         Passed - Patient is not pregnant      Passed - Valid encounter within last 12 months    Recent Outpatient Visits          1 month ago Right sided sciatica   [facility], Ranell Patrick, MD   2 months ago Acquired hypothyroidism   [facility], Ranell Patrick, MD   4 months ago Acquired hypothyroidism   [facility], Ranell Patrick, MD   9 months ago Acquired hypothyroidism   [facility], Lilia Argue, MD   9 months ago Acquired hypothyroidism   [facility], Arlie Solomons, MD      Future Appointments            In 4 days Wendie Agreste, MD [facility]

## 2020-02-20 ENCOUNTER — Ambulatory Visit (INDEPENDENT_AMBULATORY_CARE_PROVIDER_SITE_OTHER): Payer: BC Managed Care – PPO | Admitting: Family Medicine

## 2020-02-20 ENCOUNTER — Encounter: Payer: Self-pay | Admitting: Family Medicine

## 2020-02-20 ENCOUNTER — Other Ambulatory Visit: Payer: Self-pay

## 2020-02-20 VITALS — BP 158/92 | HR 82 | Temp 97.7°F | Resp 15 | Ht 66.0 in | Wt 200.0 lb

## 2020-02-20 DIAGNOSIS — R519 Headache, unspecified: Secondary | ICD-10-CM | POA: Diagnosis not present

## 2020-02-20 DIAGNOSIS — I10 Essential (primary) hypertension: Secondary | ICD-10-CM | POA: Diagnosis not present

## 2020-02-20 DIAGNOSIS — M5431 Sciatica, right side: Secondary | ICD-10-CM

## 2020-02-20 MED ORDER — METOPROLOL SUCCINATE ER 25 MG PO TB24: 25.0000 mg | ORAL_TABLET | Freq: Every day | ORAL | 1 refills | Status: DC

## 2020-02-20 NOTE — Patient Instructions (Addendum)
  For back pain: Okay to continue to use a lumbar roll,stretches, range of motion, short-term meloxicam or Flexeril if needed.  If you have worsening pain or does not continue to improve follow-up and we can discuss other treatment.    Blood pressure still running a little bit too high, can try taking higher dose Toprol - 50 +25 mg each day for a total dose of 75 mg daily.  If any low blood pressures, lightheadedness, dizziness on higher dose, then return to 50 mg and let me know. If headaches are not improving with improved blood pressure control, follow-up to discuss this further. Return to the clinic or go to the nearest emergency room if any of your symptoms worsen or new symptoms occur.    If you have lab work done today you will be contacted with your lab results within the next 2 weeks.  If you have not heard from [place] then please contact us. The fastest way to get your results is to register for My Chart.   IF you received an x-ray today, you will receive an invoice from [facility] Radiology. Please contact [facility] Radiology at [phone] with questions or concerns regarding your invoice.   IF you received labwork today, you will receive an invoice from [facility]. Please contact [facility] at [phone] with questions or concerns regarding your invoice.   Our billing staff will not be able to assist you with questions regarding bills from these companies.  You will be contacted with the lab results as soon as they are available. The fastest way to get your results is to activate your My Chart account. Instructions are located on the last page of this paperwork. If you have not heard from [place] regarding the results in 2 weeks, please contact this office.
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
## 2020-02-25 ENCOUNTER — Ambulatory Visit (AMBULATORY_SURGERY_CENTER): Payer: Self-pay | Admitting: *Deleted

## 2020-02-25 ENCOUNTER — Encounter: Payer: Self-pay | Admitting: Gastroenterology

## 2020-02-25 ENCOUNTER — Other Ambulatory Visit: Payer: Self-pay

## 2020-02-25 VITALS — Temp 97.0°F | Ht 66.0 in | Wt 201.4 lb

## 2020-02-25 DIAGNOSIS — Z1211 Encounter for screening for malignant neoplasm of colon: Secondary | ICD-10-CM

## 2020-02-25 DIAGNOSIS — Z1159 Encounter for screening for other viral diseases: Secondary | ICD-10-CM

## 2020-02-25 MED ORDER — SUPREP BOWEL PREP KIT 17.5-3.13-1.6 GM/177ML PO SOLN: 1.0000 | Freq: Once | ORAL | 0 refills | Status: AC

## 2020-02-25 NOTE — Progress Notes (Signed)
Patient denies any allergies to egg or soy products. Patient denies complications with anesthesia/sedation.  Patient denies oxygen use at home and denies diet medications. Emmi instructions for colonoscopy/endoscopy explained and given to patient.  Suprep coupon given.   
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
## 2020-03-04 ENCOUNTER — Other Ambulatory Visit: Payer: Self-pay

## 2020-03-04 ENCOUNTER — Other Ambulatory Visit (HOSPITAL_COMMUNITY)
Admission: RE | Admit: 2020-03-04 | Discharge: 2020-03-04 | Disposition: A | Discharge status: Home / Self Care | Payer: BC Managed Care – PPO | Source: Ambulatory Visit | Attending: Obstetrics and Gynecology | Admitting: Obstetrics and Gynecology

## 2020-03-04 ENCOUNTER — Ambulatory Visit (INDEPENDENT_AMBULATORY_CARE_PROVIDER_SITE_OTHER): Payer: BC Managed Care – PPO | Admitting: Obstetrics and Gynecology

## 2020-03-04 ENCOUNTER — Encounter: Payer: Self-pay | Admitting: Obstetrics and Gynecology

## 2020-03-04 VITALS — BP 122/72 | HR 68 | Temp 97.2°F | Resp 16 | Ht 66.5 in | Wt 199.0 lb

## 2020-03-04 DIAGNOSIS — Z01419 Encounter for gynecological examination (general) (routine) without abnormal findings: Secondary | ICD-10-CM

## 2020-03-04 DIAGNOSIS — N951 Menopausal and female climacteric states: Secondary | ICD-10-CM

## 2020-03-04 DIAGNOSIS — Z124 Encounter for screening for malignant neoplasm of cervix: Secondary | ICD-10-CM | POA: Insufficient documentation

## 2020-03-04 DIAGNOSIS — Z113 Encounter for screening for infections with a predominantly sexual mode of transmission: Secondary | ICD-10-CM

## 2020-03-04 DIAGNOSIS — N912 Amenorrhea, unspecified: Secondary | ICD-10-CM | POA: Diagnosis not present

## 2020-03-04 LAB — POCT URINE PREGNANCY: Preg Test, Ur: NEGATIVE

## 2020-03-04 NOTE — Patient Instructions (Signed)
Please call the [facility] to get your blood work done for genetic testing.    EXERCISE AND DIET:  We recommended that you start or continue a regular exercise program for good health. Regular exercise means any activity that makes your heart beat faster and makes you sweat.  We recommend exercising at least 30 minutes per day at least 3 days a week, preferably 4 or 5.  We also recommend a diet low in fat and sugar.  Inactivity, poor dietary choices and obesity can cause diabetes, heart attack, stroke, and kidney damage, among others.    ALCOHOL AND SMOKING:  Women should limit their alcohol intake to no more than 7 drinks/beers/glasses of wine (combined, not each!) per week. Moderation of alcohol intake to this level decreases your risk of breast cancer and liver damage. And of course, no recreational drugs are part of a healthy lifestyle.  And absolutely no smoking or even second hand smoke. Most people know smoking can cause heart and lung diseases, but did you know it also contributes to weakening of your bones? Aging of your skin?  Yellowing of your teeth and nails?  CALCIUM AND VITAMIN D:  Adequate intake of calcium and Vitamin D are recommended.  The recommendations for exact amounts of these supplements seem to change often, but generally speaking 600 mg of calcium (either carbonate or citrate) and 800 units of Vitamin D per day seems prudent. Certain women may benefit from higher intake of Vitamin D.  If you are among these women, your doctor will have told you during your visit.    PAP SMEARS:  Pap smears, to check for cervical cancer or precancers,  have traditionally been done yearly, although recent scientific advances have shown that most women can have pap smears less often.  However, every woman still should have a physical exam from her gynecologist every year. It will include a breast check, inspection of the vulva and vagina to check for abnormal growths or skin changes,  a visual exam of the cervix, and then an exam to evaluate the size and shape of the uterus and ovaries.  And after 55 years of age, a rectal exam is indicated to check for rectal cancers. We will also provide age appropriate advice regarding health maintenance, like when you should have certain vaccines, screening for sexually transmitted diseases, bone density testing, colonoscopy, mammograms, etc.   MAMMOGRAMS:  All women over 55 years old should have a yearly mammogram. Many facilities now offer a "3D" mammogram, which may cost around $50 extra out of pocket. If possible,  we recommend you accept the option to have the 3D mammogram performed.  It both reduces the number of women who will be called back for extra views which then turn out to be normal, and it is better than the routine mammogram at detecting truly abnormal areas.    COLONOSCOPY:  Colonoscopy to screen for colon cancer is recommended for all women at age 55.  We know, you hate the idea of the prep.  We agree, BUT, having colon cancer and not knowing it is worse!!  Colon cancer so often starts as a polyp that can be seen and removed at colonscopy, which can quite literally save your life!  And if your first colonoscopy is normal and you have no family history of colon cancer, most women don't have to have it again for 10 years.  Once every ten years, you can do something that may end up  saving your life, right?  We will be happy to help you get it scheduled when you are ready.  Be sure to check your insurance coverage so you understand how much it will cost.  It may be covered as a preventative service at no cost, but you should check your particular policy.

## 2020-03-05 LAB — CYTOLOGY - PAP
Chlamydia: NEGATIVE
Comment: NEGATIVE
Comment: NEGATIVE
Comment: NEGATIVE
Comment: NORMAL
Diagnosis: NEGATIVE
High risk HPV: NEGATIVE
Neisseria Gonorrhea: NEGATIVE
Trichomonas: NEGATIVE

## 2020-03-06 ENCOUNTER — Ambulatory Visit (INDEPENDENT_AMBULATORY_CARE_PROVIDER_SITE_OTHER): Payer: BC Managed Care – PPO

## 2020-03-06 ENCOUNTER — Telehealth: Payer: Self-pay | Admitting: *Deleted

## 2020-03-06 ENCOUNTER — Other Ambulatory Visit: Payer: Self-pay | Admitting: Gastroenterology

## 2020-03-06 DIAGNOSIS — Z1159 Encounter for screening for other viral diseases: Secondary | ICD-10-CM

## 2020-03-06 LAB — ESTRADIOL: Estradiol: 56.5 pg/mL

## 2020-03-06 LAB — HEP, RPR, HIV PANEL
HIV Screen 4th Generation wRfx: NONREACTIVE
Hepatitis B Surface Ag: NEGATIVE
RPR Ser Ql: NONREACTIVE

## 2020-03-06 LAB — HEPATITIS C ANTIBODY: Hep C Virus Ab: <0.1 s/co ratio (ref 0.0–0.9)

## 2020-03-06 LAB — SARS CORONAVIRUS 2 (TAT 6-24 HRS): SARS Coronavirus 2: NEGATIVE

## 2020-03-06 LAB — FOLLICLE STIMULATING HORMONE: FSH: 12 m[IU]/mL

## 2020-03-06 NOTE — Telephone Encounter (Signed)
Burnice Logan, RN  03/06/2020 4:09 PM EST    Left message to call Sharee Pimple, RN at [facility].   Next AEX 04/15/21

## 2020-03-06 NOTE — Telephone Encounter (Signed)
-----   Message from Nunzio Cobbs, MD sent at 03/06/2020  8:48 AM EST ----- Please contact patient with results of testing.   Her hormones indicate she is perimenopausal but not menopausal.  She has not had a period in several months.  I do recommend a course of Provera 10 mg x 10 days to shed any build up of uterine lining that is present.  This will bring on a menstrual cycle.   Her testing is negative for STDs including HIV, syphilis, hepatitis B and C, gonorrhea, chlamydia, and trichomonas.   Her pap is normal and her HR HPV is negative.  Annual exam recall - 02.

## 2020-03-07 ENCOUNTER — Encounter: Payer: Self-pay | Admitting: Gastroenterology

## 2020-03-07 MED ORDER — MEDROXYPROGESTERONE ACETATE 10 MG PO TABS: 10.0000 mg | ORAL_TABLET | Freq: Every day | ORAL | 0 refills | Status: DC

## 2020-03-07 NOTE — Telephone Encounter (Signed)
Spoke with patient, advised of results as seen below per Dr. Quincy Simmonds. Patient is scheduled for colonoscopy on 03/11/20, will start Provera after colonoscopy. Patient will provide update if menses or no menses after completing medication, advised it can take 2 wks after last dose for menses to start. Patient verbalizes understanding and is agreeable.   Encounter closed.

## 2020-03-10 ENCOUNTER — Telehealth: Payer: Self-pay | Admitting: Gastroenterology

## 2020-03-10 NOTE — Telephone Encounter (Signed)
Pt is scheduled for a colonoscopy 03/11/20 and stated that she cannot afford prep soln.

## 2020-03-10 NOTE — Telephone Encounter (Signed)
Called patient about prep issues. She stated that she spoke to someone already, and was instructed to come to office to pick up prep sample.

## 2020-03-11 ENCOUNTER — Encounter: Payer: Self-pay | Admitting: Gastroenterology

## 2020-03-11 ENCOUNTER — Other Ambulatory Visit: Payer: Self-pay

## 2020-03-11 ENCOUNTER — Ambulatory Visit (AMBULATORY_SURGERY_CENTER): Payer: BC Managed Care – PPO | Admitting: Gastroenterology

## 2020-03-11 VITALS — BP 165/96 | HR 64 | Temp 96.9°F | Resp 14 | Ht 66.0 in | Wt 201.0 lb

## 2020-03-11 DIAGNOSIS — D122 Benign neoplasm of ascending colon: Secondary | ICD-10-CM

## 2020-03-11 DIAGNOSIS — Z1211 Encounter for screening for malignant neoplasm of colon: Secondary | ICD-10-CM

## 2020-03-11 DIAGNOSIS — D127 Benign neoplasm of rectosigmoid junction: Secondary | ICD-10-CM

## 2020-03-11 MED ORDER — SODIUM CHLORIDE 0.9 % IV SOLN: 500.0000 mL | Freq: Once | INTRAVENOUS | Status: DC

## 2020-03-11 NOTE — Patient Instructions (Signed)
Handouts given:  Diverticulosis, Hemorrhoids, polyps Resume previous diet Continue present medications Await pathology results   YOU HAD AN ENDOSCOPIC PROCEDURE TODAY AT [facility]:   Refer to the procedure report that was given to you for any specific questions about what was found during the examination.  If the procedure report does not answer your questions, please call your gastroenterologist to clarify.  If you requested that your care partner not be given the details of your procedure findings, then the procedure report has been included in a sealed envelope for you to review at your convenience later.  YOU SHOULD EXPECT: Some feelings of bloating in the abdomen. Passage of more gas than usual.  Walking can help get rid of the air that was put into your GI tract during the procedure and reduce the bloating. If you had a lower endoscopy (such as a colonoscopy or flexible sigmoidoscopy) you may notice spotting of blood in your stool or on the toilet paper. If you underwent a bowel prep for your procedure, you may not have a normal bowel movement for a few days.  Please Note:  You might notice some irritation and congestion in your nose or some drainage.  This is from the oxygen used during your procedure.  There is no need for concern and it should clear up in a day or so.  SYMPTOMS TO REPORT IMMEDIATELY:   Following lower endoscopy (colonoscopy or flexible sigmoidoscopy):  Excessive amounts of blood in the stool  Significant tenderness or worsening of abdominal pains  Swelling of the abdomen that is new, acute  Fever of 100F or higher     For urgent or emergent issues, a gastroenterologist can be reached at any hour by calling [phone]. Do not use MyChart messaging for urgent concerns.    DIET:  We do recommend a small meal at first, but then you may proceed to your regular diet.  Drink plenty of fluids but you should avoid alcoholic beverages for 24  hours.  ACTIVITY:  You should plan to take it easy for the rest of today and you should NOT DRIVE or use heavy machinery until tomorrow (because of the sedation medicines used during the test).    FOLLOW UP: Our staff will call the number listed on your records 48-72 hours following your procedure to check on you and address any questions or concerns that you may have regarding the information given to you following your procedure. If we do not reach you, we will leave a message.  We will attempt to reach you two times.  During this call, we will ask if you have developed any symptoms of COVID 19. If you develop any symptoms (ie: fever, flu-like symptoms, shortness of breath, cough etc.) before then, please call [phone].  If you test positive for Covid 19 in the 2 weeks post procedure, please call and report this information to [place].    If any biopsies were taken you will be contacted by phone or by letter within the next 1-3 weeks.  Please call us at [phone] if you have not heard about the biopsies in 3 weeks.    SIGNATURES/CONFIDENTIALITY: You and/or your care partner have signed paperwork which will be entered into your electronic medical record.  These signatures attest to the fact that that the information above on your After Visit Summary has been reviewed and is understood.  Full responsibility of the confidentiality of this discharge information lies with you and/or your  care-partner.

## 2020-03-11 NOTE — Progress Notes (Signed)
pt tolerated well. VSS. awake and to recovery. Report given to RN.  

## 2020-03-11 NOTE — Progress Notes (Signed)
Temp by LC, vitals by DT  Pt's states no medical or surgical changes since previsit or office visit.

## 2020-03-11 NOTE — Op Note (Signed)
[facility] Patient Name: Megan Bullock Procedure Date: 03/11/2020 8:38 AM MRN: [id number] Endoscopist: Remo Lipps P. Havery Moros , MD Age: 54 Referring MD:  Date of Birth: [birth year] Gender: Female Account #: [id number] Procedure:                Colonoscopy Indications:              Screening for colorectal malignant neoplasm, This                            is the patient's first colonoscopy Medicines:                Monitored Anesthesia Care Procedure:                Pre-Anesthesia Assessment:                           - Prior to the procedure, a History and Physical                            was performed, and patient medications and                            allergies were reviewed. The patient's tolerance of                            previous anesthesia was also reviewed. The risks                            and benefits of the procedure and the sedation                            options and risks were discussed with the patient.                            All questions were answered, and informed consent                            was obtained. Prior Anticoagulants: The patient has                            taken no previous anticoagulant or antiplatelet                            agents. ASA Grade Assessment: II - A patient with                            mild systemic disease. After reviewing the risks                            and benefits, the patient was deemed in                            satisfactory condition to undergo the procedure.  After obtaining informed consent, the colonoscope                            was passed under direct vision. Throughout the                            procedure, the patient's blood pressure, pulse, and                            oxygen saturations were monitored continuously. The                            Colonoscope was introduced through the anus and                            advanced to the the  cecum, identified by                            appendiceal orifice and ileocecal valve. The                            colonoscopy was performed without difficulty. The                            patient tolerated the procedure well. The quality                            of the bowel preparation was good. The ileocecal                            valve, appendiceal orifice, and rectum were                            photographed. Scope In: 8:42:08 AM Scope Out: 9:05:18 AM Scope Withdrawal Time: 0 hours 18 minutes 29 seconds  Total Procedure Duration: 0 hours 23 minutes 10 seconds  Findings:                 The perianal and digital rectal examinations were                            normal.                           Multiple small-mouthed diverticula were found in                            the sigmoid colon.                           A 4 mm polyp was found in the ascending colon. The                            polyp was sessile. The polyp was removed with a  cold snare. Resection and retrieval were complete.                           A 3 mm polyp was found in the recto-sigmoid colon.                            The polyp was sessile. The polyp was removed with a                            cold snare. Resection and retrieval were complete.                           The colon was tortuous.                           There was some lipomatous changes in the ascending                            colon. Retroflexed views of the rectum not obtained                            given small size of the rectum. The exam was                            otherwise without abnormality. Complications:            No immediate complications. Estimated blood loss:                            Minimal. Estimated Blood Loss:     Estimated blood loss was minimal. Impression:               - Diverticulosis in the sigmoid colon.                           - One 4 mm polyp in the ascending  colon, removed                            with a cold snare. Resected and retrieved.                           - One 3 mm polyp at the recto-sigmoid colon,                            removed with a cold snare. Resected and retrieved.                           - Tortuous colon.                           - The examination was otherwise normal. Recommendation:           - Patient has a contact number available for  emergencies. The signs and symptoms of potential                            delayed complications were discussed with the                            patient. Return to normal activities tomorrow.                            Written discharge instructions were provided to the                            patient.                           - Resume previous diet.                           - Continue present medications.                           - Await pathology results. Remo Lipps P. Saim Almanza, MD 03/11/2020 9:10:28 AM This report has been signed electronically.

## 2020-03-11 NOTE — Progress Notes (Signed)
Called to room to assist during endoscopic procedure.  Patient ID and intended procedure confirmed with present staff. Received instructions for my participation in the procedure from the performing physician.  

## 2020-03-13 ENCOUNTER — Telehealth: Payer: Self-pay

## 2020-03-13 NOTE — Telephone Encounter (Signed)
  Follow up Call-  Call back number 03/11/2020  Post procedure Call Back phone  # [phone]  Permission to leave phone message Yes  Some recent data might be hidden     Patient questions:  Do you have a fever, pain , or abdominal swelling? No. Pain Score  0 *  Have you tolerated food without any problems? Yes.    Have you been able to return to your normal activities? Yes.    Do you have any questions about your discharge instructions: Diet   No. Medications  No. Follow up visit  no  Do you have questions or concerns about your Care? No.  Actions: * If pain score is 4 or above: No action needed, pain <4.  1. Have you developed a fever since your procedure? no  2.   Have you had an respiratory symptoms (SOB or cough) since your procedure? no  3.   Have you tested positive for COVID 19 since your procedure no  4.   Have you had any family members/close contacts diagnosed with the COVID 19 since your procedure?  no   If yes to any of these questions please route to Joylene John, RN and Alphonsa Gin, Therapist, sports.

## 2020-03-13 NOTE — Telephone Encounter (Signed)
First attempt follow up call to pt, lm on vm 

## 2020-03-17 ENCOUNTER — Encounter: Payer: Self-pay | Admitting: Certified Nurse Midwife

## 2020-05-21 ENCOUNTER — Ambulatory Visit (INDEPENDENT_AMBULATORY_CARE_PROVIDER_SITE_OTHER): Payer: BC Managed Care – PPO | Admitting: Family Medicine

## 2020-05-21 ENCOUNTER — Other Ambulatory Visit: Payer: Self-pay

## 2020-05-21 ENCOUNTER — Ambulatory Visit (INDEPENDENT_AMBULATORY_CARE_PROVIDER_SITE_OTHER): Payer: BC Managed Care – PPO

## 2020-05-21 VITALS — BP 130/84 | HR 61 | Temp 97.6°F | Resp 14 | Ht 66.0 in | Wt 201.0 lb

## 2020-05-21 DIAGNOSIS — E039 Hypothyroidism, unspecified: Secondary | ICD-10-CM

## 2020-05-21 DIAGNOSIS — F439 Reaction to severe stress, unspecified: Secondary | ICD-10-CM

## 2020-05-21 DIAGNOSIS — M5431 Sciatica, right side: Secondary | ICD-10-CM

## 2020-05-21 DIAGNOSIS — E785 Hyperlipidemia, unspecified: Secondary | ICD-10-CM

## 2020-05-21 DIAGNOSIS — J3089 Other allergic rhinitis: Secondary | ICD-10-CM | POA: Diagnosis not present

## 2020-05-21 DIAGNOSIS — I10 Essential (primary) hypertension: Secondary | ICD-10-CM

## 2020-05-21 DIAGNOSIS — M5416 Radiculopathy, lumbar region: Secondary | ICD-10-CM

## 2020-05-21 MED ORDER — FLUTICASONE PROPIONATE 50 MCG/ACT NA SUSP: 2.0000 | Freq: Every day | NASAL | 6 refills | Status: DC

## 2020-05-21 MED ORDER — ATORVASTATIN CALCIUM 20 MG PO TABS: 20.0000 mg | ORAL_TABLET | Freq: Every day | ORAL | 3 refills | Status: DC

## 2020-05-21 MED ORDER — METOPROLOL SUCCINATE ER 25 MG PO TB24: 25.0000 mg | ORAL_TABLET | Freq: Every day | ORAL | 1 refills | Status: DC

## 2020-05-21 MED ORDER — CETIRIZINE HCL 10 MG PO TABS: 10.0000 mg | ORAL_TABLET | Freq: Every day | ORAL | 3 refills | Status: DC

## 2020-05-21 MED ORDER — LEVOTHYROXINE SODIUM 112 MCG PO TABS: 112.0000 ug | ORAL_TABLET | Freq: Every day | ORAL | 1 refills | Status: DC

## 2020-05-21 MED ORDER — PREDNISONE 20 MG PO TABS: 40.0000 mg | ORAL_TABLET | Freq: Every day | ORAL | 0 refills | Status: DC

## 2020-05-21 MED ORDER — METOPROLOL SUCCINATE ER 50 MG PO TB24: 50.0000 mg | ORAL_TABLET | Freq: Every day | ORAL | 1 refills | Status: DC

## 2020-05-21 NOTE — Patient Instructions (Addendum)
Prednisone 2 pills/day for 5 days for back pain.  Do not take ibuprofen, Aleve or any anti-inflammatories while taking prednisone.  I will refer you to back specialist as well as check x-ray and let you know if there are concerns today.  Recheck 2 weeks.  I am sorry to hear about the stressors in life right now.  See information below, but also provided a few with numbers for counseling if needed.  No other med changes at this time.  I will check some lab work which we can discuss further in 2 weeks.  Return to the clinic or go to the nearest emergency room if any of your symptoms worsen or new symptoms occur.   Here are a few options for counseling:  [facility]:  [place] [phone]    Stress, Adult Stress is a normal reaction to life events. Stress is what you feel when life demands more than you are used to, or more than you think you can handle. Some stress can be useful, such as studying for a test or meeting a deadline at work. Stress that occurs too often or for too long can cause problems. It can affect your emotional health and interfere with relationships and normal daily activities. Too much stress can weaken your body's defense system (immune system) and increase your risk for physical illness. If you already have a medical problem, stress can make it worse. What are the causes? All sorts of life events can cause stress. An event that causes stress for one person may not be stressful for another person. Major life events, whether positive or negative, commonly cause stress. Examples include:  Losing a job or starting a new job.  Losing a loved one.  Moving to a new town or home.  Getting married or divorced.  Having a baby.  Getting injured or sick. Less obvious life events can also cause stress, especially if they occur day after day or in combination with each other. Examples include:  Working long  hours.  Driving in traffic.  Caring for children.  Being in debt.  Being in a difficult relationship. What are the signs or symptoms? Stress can cause emotional symptoms, including:  Anxiety. This is feeling worried, afraid, on edge, overwhelmed, or out of control.  Anger, including irritation or impatience.  Depression. This is feeling sad, down, helpless, or guilty.  Trouble focusing, remembering, or making decisions. Stress can cause physical symptoms, including:  Aches and pains. These may affect your head, neck, back, stomach, or other areas of your body.  Tight muscles or a clenched jaw.  Low energy.  Trouble sleeping. Stress can cause unhealthy behaviors, including:  Eating to feel better (overeating) or skipping meals.  Working too much or putting off tasks.  Smoking, drinking alcohol, or using drugs to feel better. How is this diagnosed? Stress is diagnosed through an assessment by your health care provider. He or she may diagnose this condition based on:  Your symptoms and any stressful life events.  Your medical history.  Tests to rule out other causes of your symptoms. Depending on your condition, your health care provider may refer you to a specialist for further evaluation. How is this treated?  Stress management techniques are the recommended treatment for stress. Medicine is not typically recommended for the treatment of stress. Techniques to reduce your reaction to stressful life events include:  Stress identification. Monitor yourself for symptoms of stress and identify what causes stress for  you. These skills may help you to avoid or prepare for stressful events.  Time management. Set your priorities, keep a calendar of events, and learn to say no. Taking these actions can help you avoid making too many commitments. Techniques for coping with stress include:  Rethinking the problem. Try to think realistically about stressful events rather than  ignoring them or overreacting. Try to find the positives in a stressful situation rather than focusing on the negatives.  Exercise. Physical exercise can release both physical and emotional tension. The key is to find a form of exercise that you enjoy and do it regularly.  Relaxation techniques. These relax the body and mind. The key is to find one or more that you enjoy and use the techniques regularly. Examples include: ? Meditation, deep breathing, or progressive relaxation techniques. ? Yoga or tai chi. ? Biofeedback, mindfulness techniques, or journaling. ? Listening to music, being out in nature, or participating in other hobbies.  Practicing a healthy lifestyle. Eat a balanced diet, drink plenty of water, limit or avoid caffeine, and get plenty of sleep.  Having a strong support network. Spend time with family, friends, or other people you enjoy being around. Express your feelings and talk things over with someone you trust. Counseling or talk therapy with a mental health professional may be helpful if you are having trouble managing stress on your own. Follow these instructions at home: Lifestyle   Avoid drugs.  Do not use any products that contain nicotine or tobacco, such as cigarettes, e-cigarettes, and chewing tobacco. If you need help quitting, ask your health care provider.  Limit alcohol intake to no more than 1 drink a day for nonpregnant women and 2 drinks a day for men. One drink equals 12 oz of beer, 5 oz of wine, or 1 oz of hard liquor  Do not use alcohol or drugs to relax.  Eat a balanced diet that includes fresh fruits and vegetables, whole grains, lean meats, fish, eggs, and beans, and low-fat dairy. Avoid processed foods and foods high in added fat, sugar, and salt.  Exercise at least 30 minutes on 5 or more days each week.  Get 7-8 hours of sleep each night. General instructions   Practice stress management techniques as discussed with your health care  provider.  Drink enough fluid to keep your urine clear or pale yellow.  Take over-the-counter and prescription medicines only as told by your health care provider.  Keep all follow-up visits as told by your health care provider. This is important. Contact a health care provider if:  Your symptoms get worse.  You have new symptoms.  You feel overwhelmed by your problems and can no longer manage them on your own. Get help right away if:  You have thoughts of hurting yourself or others. If you ever feel like you may hurt yourself or others, or have thoughts about taking your own life, get help right away. You can go to your nearest emergency department or call:  Your local emergency services (911 in the [place]  A suicide crisis helpline, such as the [place] at (316)246-8749. This is open 24 hours a day. Summary  Stress is a normal reaction to life events. It can cause problems if it happens too often or for too long.  Practicing stress management techniques is the best way to treat stress.  Counseling or talk therapy with a mental health professional may be helpful if you are having trouble managing stress  on your own. This information is not intended to replace advice given to you by your health care provider. Make sure you discuss any questions you have with your health care provider. Document Revised: 07/13/2019 Document Reviewed: 02/02/2017 Elsevier Patient Education  [employer].   If you have lab work done today you will be contacted with your lab results within the next 2 weeks.  If you have not heard from [place] then please contact us. The fastest way to get your results is to register for My Chart.   IF you received an x-ray today, you will receive an invoice from [facility]. Please contact [facility] at [phone] with questions or concerns regarding your invoice.   IF you received labwork today, you will receive an  invoice from [facility]. Please contact [facility] at [phone] with questions or concerns regarding your invoice.   Our billing staff will not be able to assist you with questions regarding bills from these companies.  You will be contacted with the lab results as soon as they are available. The fastest way to get your results is to activate your My Chart account. Instructions are located on the last page of this paperwork. If you have not heard from [place] regarding the results in 2 weeks, please contact this office.
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
## 2020-05-22 ENCOUNTER — Encounter: Payer: Self-pay | Admitting: Family Medicine

## 2020-05-22 LAB — TSH: TSH: 2.39 u[IU]/mL (ref 0.450–4.500)

## 2020-05-22 LAB — COMPREHENSIVE METABOLIC PANEL
ALT: 13 IU/L (ref 0–32)
AST: 14 IU/L (ref 0–40)
Albumin/Globulin Ratio: 2 (ref 1.2–2.2)
Albumin: 4.2 g/dL (ref 3.8–4.9)
Alkaline Phosphatase: 93 IU/L (ref 48–121)
BUN/Creatinine Ratio: 9 (ref 9–23)
BUN: 8 mg/dL (ref 6–24)
Bilirubin Total: 0.3 mg/dL (ref 0.0–1.2)
CO2: 25 mmol/L (ref 20–29)
Calcium: 9.7 mg/dL (ref 8.7–10.2)
Chloride: 103 mmol/L (ref 96–106)
Creatinine, Ser: 0.92 mg/dL (ref 0.57–1.00)
GFR calc Af Amer: 82 mL/min/{1.73_m2} (ref >59)
GFR calc non Af Amer: 71 mL/min/{1.73_m2} (ref >59)
Globulin, Total: 2.1 g/dL (ref 1.5–4.5)
Glucose: 77 mg/dL (ref 65–99)
Potassium: 4.1 mmol/L (ref 3.5–5.2)
Sodium: 140 mmol/L (ref 134–144)
Total Protein: 6.3 g/dL (ref 6.0–8.5)

## 2020-05-22 LAB — LIPID PANEL
Chol/HDL Ratio: 3 ratio (ref 0.0–4.4)
Cholesterol, Total: 129 mg/dL (ref 100–199)
HDL: 43 mg/dL (ref >39)
LDL Chol Calc (NIH): 69 mg/dL (ref 0–99)
Triglycerides: 85 mg/dL (ref 0–149)
VLDL Cholesterol Cal: 17 mg/dL (ref 5–40)

## 2020-05-25 ENCOUNTER — Other Ambulatory Visit: Payer: Self-pay | Admitting: Family Medicine

## 2020-05-25 DIAGNOSIS — M5431 Sciatica, right side: Secondary | ICD-10-CM

## 2020-05-25 DIAGNOSIS — M5416 Radiculopathy, lumbar region: Secondary | ICD-10-CM

## 2020-05-25 NOTE — Telephone Encounter (Signed)
Requested medication (s) are due for refill today: yes  Requested medication (s) are on the active medication list: no  Last refill:  09/21/19  Future visit scheduled: yes  Notes to clinic:  Med not delegated to NT to refill, refuse --Prescription expired 01/16/20   Requested Prescriptions  Pending Prescriptions Disp Refills   cyclobenzaprine (FLEXERIL) 5 MG tablet [Pharmacy Med Name: CYCLOBENZAPRINE 5 MG TABLET] 90 tablet 0    Sig: TAKE 1 TABLET BY MOUTH 3 TIMES A DAY AS NEEDED      Not Delegated - Analgesics:  Muscle Relaxants Failed - 05/25/2020  3:54 PM      Failed - This refill cannot be delegated      Passed - Valid encounter within last 6 months    Recent Outpatient Visits           4 days ago Right sided sciatica   [facility], Ranell Patrick, MD   3 months ago Right sided sciatica   [facility], Ranell Patrick, MD   4 months ago Right sided sciatica   [facility], MD   5 months ago Acquired hypothyroidism   [facility], Ranell Patrick, MD   8 months ago Acquired hypothyroidism   [facility], Ranell Patrick, MD       Future Appointments             In 1 week Carlota Raspberry Ranell Patrick, MD [facility] [place], [facility]

## 2020-06-05 ENCOUNTER — Ambulatory Visit (INDEPENDENT_AMBULATORY_CARE_PROVIDER_SITE_OTHER): Payer: BC Managed Care – PPO | Admitting: Family Medicine

## 2020-06-05 ENCOUNTER — Other Ambulatory Visit: Payer: Self-pay

## 2020-06-05 ENCOUNTER — Encounter: Payer: Self-pay | Admitting: Family Medicine

## 2020-06-05 VITALS — BP 153/94 | HR 84 | Temp 98.1°F | Ht 66.0 in | Wt 205.0 lb

## 2020-06-05 DIAGNOSIS — M5416 Radiculopathy, lumbar region: Secondary | ICD-10-CM

## 2020-06-05 DIAGNOSIS — M47819 Spondylosis without myelopathy or radiculopathy, site unspecified: Secondary | ICD-10-CM

## 2020-06-05 DIAGNOSIS — F439 Reaction to severe stress, unspecified: Secondary | ICD-10-CM | POA: Diagnosis not present

## 2020-06-05 DIAGNOSIS — R600 Localized edema: Secondary | ICD-10-CM

## 2020-06-05 NOTE — Patient Instructions (Addendum)
  Cut back on salt in the diet, primarily through restaurant food and fast food.  That may have been the cause of the swelling in the ankles recently.  If that does not improve or any worsening swelling, return for recheck.  Keep follow-up with neurosurgery as planned. Flexeril if needed for back pain, follow-up if that acutely worsens prior to appointment with the specialist.  Call counseling as planned.  Let me know if other numbers needed.  Thanks for coming in today and take care.    If you have lab work done today you will be contacted with your lab results within the next 2 weeks.  If you have not heard from [place] then please contact us. The fastest way to get your results is to register for My Chart.   IF you received an x-ray today, you will receive an invoice from [facility]. Please contact [facility] at [phone] with questions or concerns regarding your invoice.   IF you received labwork today, you will receive an invoice from [facility]. Please contact [facility] at [phone] with questions or concerns regarding your invoice.   Our billing staff will not be able to assist you with questions regarding bills from these companies.  You will be contacted with the lab results as soon as they are available. The fastest way to get your results is to activate your My Chart account. Instructions are located on the last page of this paperwork. If you have not heard from [place] regarding the results in 2 weeks, please contact this office.
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
## 2020-07-23 ENCOUNTER — Telehealth: Payer: Self-pay | Admitting: Family Medicine

## 2020-07-23 NOTE — Telephone Encounter (Signed)
Pt Called states she need the list of mental health number she states DR Carlota Raspberry given to her and she lost it if one one can send to her  on [employer] Advice

## 2020-07-23 NOTE — Telephone Encounter (Signed)
Pt is needing mental health #s that were give at last OV.  Please Advise

## 2020-09-29 ENCOUNTER — Encounter (HOSPITAL_COMMUNITY): Payer: Self-pay

## 2020-09-29 ENCOUNTER — Other Ambulatory Visit: Payer: Self-pay

## 2020-09-29 ENCOUNTER — Emergency Department (HOSPITAL_COMMUNITY)
Admission: EM | Admit: 2020-09-29 | Discharge: 2020-09-29 | Disposition: A | Discharge status: Home / Self Care | Payer: BC Managed Care – PPO | Attending: Emergency Medicine | Admitting: Emergency Medicine

## 2020-09-29 DIAGNOSIS — Z79899 Other long term (current) drug therapy: Secondary | ICD-10-CM | POA: Insufficient documentation

## 2020-09-29 DIAGNOSIS — E039 Hypothyroidism, unspecified: Secondary | ICD-10-CM | POA: Insufficient documentation

## 2020-09-29 DIAGNOSIS — Z23 Encounter for immunization: Secondary | ICD-10-CM | POA: Diagnosis not present

## 2020-09-29 DIAGNOSIS — J45909 Unspecified asthma, uncomplicated: Secondary | ICD-10-CM | POA: Diagnosis not present

## 2020-09-29 DIAGNOSIS — Z853 Personal history of malignant neoplasm of breast: Secondary | ICD-10-CM | POA: Insufficient documentation

## 2020-09-29 DIAGNOSIS — I10 Essential (primary) hypertension: Secondary | ICD-10-CM | POA: Diagnosis not present

## 2020-09-29 DIAGNOSIS — X100XXA Contact with hot drinks, initial encounter: Secondary | ICD-10-CM | POA: Insufficient documentation

## 2020-09-29 DIAGNOSIS — T22212A Burn of second degree of left forearm, initial encounter: Secondary | ICD-10-CM

## 2020-09-29 DIAGNOSIS — F17290 Nicotine dependence, other tobacco product, uncomplicated: Secondary | ICD-10-CM | POA: Insufficient documentation

## 2020-09-29 HISTORY — DX: Malignant (primary) neoplasm, unspecified: C80.1

## 2020-09-29 MED ORDER — ACETAMINOPHEN 325 MG PO TABS
650.0000 mg | ORAL_TABLET | Freq: Once | ORAL | Status: AC
  Administered 2020-09-29: 650 mg via ORAL
  Filled 2020-09-29: qty 2

## 2020-09-29 MED ORDER — BACITRACIN ZINC 500 UNIT/GM EX OINT
TOPICAL_OINTMENT | Freq: Once | CUTANEOUS | Status: AC
  Administered 2020-09-29: 1 via TOPICAL
  Filled 2020-09-29: qty 1.8

## 2020-09-29 MED ORDER — TETANUS-DIPHTH-ACELL PERTUSSIS 5-2.5-18.5 LF-MCG/0.5 IM SUSP
0.5000 mL | Freq: Once | INTRAMUSCULAR | Status: AC
  Administered 2020-09-29: 0.5 mL via INTRAMUSCULAR
  Filled 2020-09-29: qty 0.5
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
## 2020-09-29 NOTE — Discharge Instructions (Addendum)
You should apply bacitracin to your burn once daily. This is an antibiotic ointment you can buy over the counter. You can apply dry dressings to the burn if needed.  Take tylenol and ibuprofen for pain and swelling.  Follow-up with the wound clinic.  I have sent a referral to the office for you.  If you do not hear from them within 2 days you can call the office and schedule an appointment.  Return to the emergency department for any new or worsening symptoms.  Watch for signs of infection which would be worsening redness, pus draining from the wound, fever, chills.
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
## 2020-12-29 ENCOUNTER — Other Ambulatory Visit: Payer: BC Managed Care – PPO

## 2020-12-29 DIAGNOSIS — Z20822 Contact with and (suspected) exposure to covid-19: Secondary | ICD-10-CM

## 2020-12-31 LAB — NOVEL CORONAVIRUS, NAA: SARS-CoV-2, NAA: NOT DETECTED

## 2020-12-31 LAB — SARS-COV-2, NAA 2 DAY TAT

## 2021-01-02 ENCOUNTER — Other Ambulatory Visit: Payer: Self-pay | Admitting: Family Medicine

## 2021-01-02 DIAGNOSIS — I10 Essential (primary) hypertension: Secondary | ICD-10-CM

## 2021-01-02 NOTE — Telephone Encounter (Signed)
Requested medication (s) are due for refill today: yes  Requested medication (s) are on the active medication list: yes  Last refill:  10/07/2020  Future visit scheduled: no  Notes to clinic:  overdue for office visit   Requested Prescriptions  Pending Prescriptions Disp Refills   metoprolol succinate (TOPROL-XL) 50 MG 24 hr tablet [Pharmacy Med Name: METOPROLOL SUCC ER 50 MG TAB] 90 tablet 1    Sig: TAKE 1 TABLET BY MOUTH EVERY DAY TAKE WITH OR IMMEDIATELY FOLLOWING A MEAL      Cardiovascular:  Beta Blockers Failed - 01/02/2021  1:28 AM      Failed - Last BP in normal range    BP Readings from Last 1 Encounters:  09/29/20 (!) 165/110          Failed - Valid encounter within last 6 months    Recent Outpatient Visits           7 months ago Radiculopathy, lumbar region   [facility], Ranell Patrick, MD   7 months ago Right sided sciatica   [facility], Ranell Patrick, MD   10 months ago Right sided sciatica   [facility], Ranell Patrick, MD   11 months ago Right sided sciatica   [facility], Ranell Patrick, MD   1 year ago Acquired hypothyroidism   [facility], Ranell Patrick, MD                Passed - Last Heart Rate in normal range    Pulse Readings from Last 1 Encounters:  09/29/20 100

## 2021-01-15 ENCOUNTER — Other Ambulatory Visit: Payer: Self-pay | Admitting: Family Medicine

## 2021-01-15 DIAGNOSIS — I10 Essential (primary) hypertension: Secondary | ICD-10-CM

## 2021-02-18 LAB — HM MAMMOGRAPHY

## 2021-02-27 ENCOUNTER — Other Ambulatory Visit: Payer: Self-pay

## 2021-02-27 ENCOUNTER — Encounter: Payer: Self-pay | Admitting: Family Medicine

## 2021-02-27 ENCOUNTER — Ambulatory Visit (INDEPENDENT_AMBULATORY_CARE_PROVIDER_SITE_OTHER): Payer: BC Managed Care – PPO | Admitting: Family Medicine

## 2021-02-27 VITALS — BP 140/100 | HR 100 | Temp 97.9°F | Ht 66.0 in | Wt 200.6 lb

## 2021-02-27 DIAGNOSIS — E039 Hypothyroidism, unspecified: Secondary | ICD-10-CM | POA: Diagnosis not present

## 2021-02-27 DIAGNOSIS — E785 Hyperlipidemia, unspecified: Secondary | ICD-10-CM | POA: Diagnosis not present

## 2021-02-27 DIAGNOSIS — M5431 Sciatica, right side: Secondary | ICD-10-CM

## 2021-02-27 DIAGNOSIS — F418 Other specified anxiety disorders: Secondary | ICD-10-CM

## 2021-02-27 DIAGNOSIS — I10 Essential (primary) hypertension: Secondary | ICD-10-CM

## 2021-02-27 DIAGNOSIS — L918 Other hypertrophic disorders of the skin: Secondary | ICD-10-CM

## 2021-02-27 DIAGNOSIS — F4323 Adjustment disorder with mixed anxiety and depressed mood: Secondary | ICD-10-CM

## 2021-02-27 DIAGNOSIS — F439 Reaction to severe stress, unspecified: Secondary | ICD-10-CM

## 2021-02-27 DIAGNOSIS — G4700 Insomnia, unspecified: Secondary | ICD-10-CM

## 2021-02-27 MED ORDER — ATORVASTATIN CALCIUM 20 MG PO TABS: 20.0000 mg | ORAL_TABLET | Freq: Every day | ORAL | 3 refills | Status: DC

## 2021-02-27 MED ORDER — LEVOTHYROXINE SODIUM 112 MCG PO TABS: 112.0000 ug | ORAL_TABLET | Freq: Every day | ORAL | 1 refills | Status: DC

## 2021-02-27 MED ORDER — HYDROXYZINE HCL 25 MG PO TABS: 12.5000 mg | ORAL_TABLET | Freq: Three times a day (TID) | ORAL | 0 refills | Status: DC | PRN

## 2021-02-27 MED ORDER — METOPROLOL SUCCINATE ER 100 MG PO TB24: 100.0000 mg | ORAL_TABLET | Freq: Every day | ORAL | 1 refills | Status: DC

## 2021-02-27 MED ORDER — SERTRALINE HCL 25 MG PO TABS: 25.0000 mg | ORAL_TABLET | Freq: Every day | ORAL | 1 refills | Status: DC

## 2021-02-27 NOTE — Patient Instructions (Addendum)
I will check some labs today, but follow up to review in next few weeks  Increase Toprol to 153m per day.  Follow up with Dr. JRonnald Rampoffice to discuss next step for leg pain. It's possible you may need to try physical therapy again prior to injection, but discuss with neurosurgery.   Follow up to discuss sleep and depression. Meet with therapist as discussed. Start zoloft once per day. Return to the clinic or go to the nearest emergency room if any of your symptoms worsen or new symptoms occur.  Try hydroxyzine if needed for sleep.   Recheck in next few weeks.   I referred you for skin tags.   Managing Stress, Adult Feeling a certain amount of stress is normal. Stress helps our body and mind get ready to deal with the demands of life. Stress hormones can motivate you to do well at work and meet your responsibilities. However severe or long-lasting (chronic) stress can affect your mental and physical health. Chronic stress puts you at higher risk for anxiety, depression, and other health problems like digestive problems, muscle aches, heart disease, high blood pressure, and stroke. What are the causes? Common causes of stress include:  Demands from work, such as deadlines, feeling overworked, or having long hours.  Pressures at home, such as money issues, disagreements with a spouse, or parenting issues.  Pressures from major life changes, such as divorce, moving, loss of a loved one, or chronic illness. You may be at higher risk for stress-related problems if you do not get enough sleep, are in poor health, do not have emotional support, or have a mental health disorder like anxiety or depression. How to recognize stress Stress can make you:  Have trouble sleeping.  Feel sad, anxious, irritable, or overwhelmed.  Lose your appetite.  Overeat or want to eat unhealthy foods.  Want to use drugs or alcohol. Stress can also cause physical symptoms, such as:  Sore, tense muscles,  especially in the shoulders and neck.  Headaches.  Trouble breathing.  A faster heart rate.  Stomach pain, nausea, or vomiting.  Diarrhea or constipation.  Trouble concentrating. Follow these instructions at home: Lifestyle  Identify the source of your stress and your reaction to it. See a therapist who can help you change your reactions.  When there are stressful events: ? Talk about it with family, friends, or co-workers. ? Try to think realistically about stressful events and not ignore them or overreact. ? Try to find the positives in a stressful situation and not focus on the negatives. ? Cut back on responsibilities at work and home, if possible. Ask for help from friends or family members if you need it.  Find ways to cope with stress, such as: ? Meditation. ? Deep breathing. ? Yoga or tai chi. ? Progressive muscle relaxation. ? Doing art, playing music, or reading. ? Making time for fun activities. ? Spending time with family and friends.  Get support from family, friends, or spiritual resources. Eating and drinking  Eat a healthy diet. This includes: ? Eating foods that are high in fiber, such as beans, whole grains, and fresh fruits and vegetables. ? Limiting foods that are high in fat and processed sugars, such as fried and sweet foods.  Do not skip meals or overeat.  Drink enough fluid to keep your urine pale yellow. Alcohol use  Do not drink alcohol if: ? Your health care provider tells you not to drink. ? You are pregnant, may  be pregnant, or are planning to become pregnant.  Drinking alcohol is a way some people try to ease their stress. This can be dangerous, so if you drink alcohol: ? Limit how much you use to:  0-1 drink a day for women.  0-2 drinks a day for men. ? Be aware of how much alcohol is in your drink. In the [place] one drink equals one 12 oz bottle of beer (355 mL), one 5 oz glass of wine (148 mL), or one 1 oz glass of hard liquor  (44 mL). Activity  Include 30 minutes of exercise in your daily schedule. Exercise is a good stress reducer.  Include time in your day for an activity that you find relaxing. Try taking a walk, going on a bike ride, reading a book, or listening to music.  Schedule your time in a way that lowers stress, and keep a consistent schedule. Prioritize what is most important to get done.   General instructions  Get enough sleep. Try to go to sleep and get up at about the same time every day.  Take over-the-counter and prescription medicines only as told by your health care provider.  Do not use any products that contain nicotine or tobacco, such as cigarettes, e-cigarettes, and chewing tobacco. If you need help quitting, ask your health care provider.  Do not use drugs or smoke to cope with stress.  Keep all follow-up visits as told by your health care provider. This is important. Where to find support  Talk with your health care provider about stress management or finding a support group.  Find a therapist to work with you on your stress management techniques. Contact a health care provider if:  Your stress symptoms get worse.  You are unable to manage your stress at home.  You are struggling to stop using drugs or alcohol. Get help right away if:  You may be a danger to yourself or others.  You have any thoughts of death or suicide. If you ever feel like you may hurt yourself or others, or have thoughts about taking your own life, get help right away. You can go to your nearest emergency department or call:  Your local emergency services (911 in the [place]  A suicide crisis helpline, such as the [place] at (581) 405-2902. This is open 24 hours a day. Summary  Feeling a certain amount of stress is normal, but severe or long-lasting (chronic) stress can affect your mental and physical health.  Chronic stress can put you at higher risk for anxiety,  depression, and other health problems like digestive problems, muscle aches, heart disease, high blood pressure, and stroke.  You may be at higher risk for stress-related problems if you do not get enough sleep, are in poor health, lack emotional support, or have a mental health disorder like anxiety or depression.  Identify the source of your stress and your reaction to it. Try talking about stressful events with family, friends, or co-workers, finding a coping method, or getting support from spiritual resources.  If you need more help, talk with your health care provider about finding a support group or a mental health therapist. This information is not intended to replace advice given to you by your health care provider. Make sure you discuss any questions you have with your health care provider. Document Revised: 07/11/2019 Document Reviewed: 07/11/2019 Elsevier Patient Education  2021 [employer].  Insomnia Insomnia is a sleep disorder that makes it difficult to  fall asleep or stay asleep. Insomnia can cause fatigue, low energy, difficulty concentrating, mood swings, and poor performance at work or school. There are three different ways to classify insomnia:  Difficulty falling asleep.  Difficulty staying asleep.  Waking up too early in the morning. Any type of insomnia can be long-term (chronic) or short-term (acute). Both are common. Short-term insomnia usually lasts for three months or less. Chronic insomnia occurs at least three times a week for longer than three months. What are the causes? Insomnia may be caused by another condition, situation, or substance, such as:  Anxiety.  Certain medicines.  Gastroesophageal reflux disease (GERD) or other gastrointestinal conditions.  Asthma or other breathing conditions.  Restless legs syndrome, sleep apnea, or other sleep disorders.  Chronic pain.  Menopause.  Stroke.  Abuse of alcohol, tobacco, or illegal drugs.  Mental  health conditions, such as depression.  Caffeine.  Neurological disorders, such as Alzheimer's disease.  An overactive thyroid (hyperthyroidism). Sometimes, the cause of insomnia may not be known. What increases the risk? Risk factors for insomnia include:  Gender. Women are affected more often than men.  Age. Insomnia is more common as you get older.  Stress.  Lack of exercise.  Irregular work schedule or working night shifts.  Traveling between different time zones.  Certain medical and mental health conditions. What are the signs or symptoms? If you have insomnia, the main symptom is having trouble falling asleep or having trouble staying asleep. This may lead to other symptoms, such as:  Feeling fatigued or having low energy.  Feeling nervous about going to sleep.  Not feeling rested in the morning.  Having trouble concentrating.  Feeling irritable, anxious, or depressed. How is this diagnosed? This condition may be diagnosed based on:  Your symptoms and medical history. Your health care provider may ask about: ? Your sleep habits. ? Any medical conditions you have. ? Your mental health.  A physical exam. How is this treated? Treatment for insomnia depends on the cause. Treatment may focus on treating an underlying condition that is causing insomnia. Treatment may also include:  Medicines to help you sleep.  Counseling or therapy.  Lifestyle adjustments to help you sleep better. Follow these instructions at home: Eating and drinking  Limit or avoid alcohol, caffeinated beverages, and cigarettes, especially close to bedtime. These can disrupt your sleep.  Do not eat a large meal or eat spicy foods right before bedtime. This can lead to digestive discomfort that can make it hard for you to sleep.   Sleep habits  Keep a sleep diary to help you and your health care provider figure out what could be causing your insomnia. Write down: ? When you  sleep. ? When you wake up during the night. ? How well you sleep. ? How rested you feel the next day. ? Any side effects of medicines you are taking. ? What you eat and drink.  Make your bedroom a dark, comfortable place where it is easy to fall asleep. ? Put up shades or blackout curtains to block light from outside. ? Use a white noise machine to block noise. ? Keep the temperature cool.  Limit screen use before bedtime. This includes: ? Watching TV. ? Using your smartphone, tablet, or computer.  Stick to a routine that includes going to bed and waking up at the same times every day and night. This can help you fall asleep faster. Consider making a quiet activity, such as reading, part of your  nighttime routine.  Try to avoid taking naps during the day so that you sleep better at night.  Get out of bed if you are still awake after 15 minutes of trying to sleep. Keep the lights down, but try reading or doing a quiet activity. When you feel sleepy, go back to bed.   General instructions  Take over-the-counter and prescription medicines only as told by your health care provider.  Exercise regularly, as told by your health care provider. Avoid exercise starting several hours before bedtime.  Use relaxation techniques to manage stress. Ask your health care provider to suggest some techniques that may work well for you. These may include: ? Breathing exercises. ? Routines to release muscle tension. ? Visualizing peaceful scenes.  Make sure that you drive carefully. Avoid driving if you feel very sleepy.  Keep all follow-up visits as told by your health care provider. This is important. Contact a health care provider if:  You are tired throughout the day.  You have trouble in your daily routine due to sleepiness.  You continue to have sleep problems, or your sleep problems get worse. Get help right away if:  You have serious thoughts about hurting yourself or someone else. If  you ever feel like you may hurt yourself or others, or have thoughts about taking your own life, get help right away. You can go to your nearest emergency department or call:  Your local emergency services (911 in the [place]  A suicide crisis helpline, such as the [facility] at 307 093 5294. This is open 24 hours a day. Summary  Insomnia is a sleep disorder that makes it difficult to fall asleep or stay asleep.  Insomnia can be long-term (chronic) or short-term (acute).  Treatment for insomnia depends on the cause. Treatment may focus on treating an underlying condition that is causing insomnia.  Keep a sleep diary to help you and your health care provider figure out what could be causing your insomnia. This information is not intended to replace advice given to you by your health care provider. Make sure you discuss any questions you have with your health care provider. Document Revised: 10/23/2020 Document Reviewed: 10/23/2020 Elsevier Patient Education  2021 [employer].    If you have lab work done today you will be contacted with your lab results within the next 2 weeks.  If you have not heard from [place] then please contact us. The fastest way to get your results is to register for My Chart.   IF you received an x-ray today, you will receive an invoice from [facility]. Please contact [facility] at [phone] with questions or concerns regarding your invoice.   IF you received labwork today, you will receive an invoice from [facility]. Please contact [facility] at [phone] with questions or concerns regarding your invoice.   Our billing staff will not be able to assist you with questions regarding bills from these companies.  You will be contacted with the lab results as soon as they are available. The fastest way to get your results is to activate your My Chart account. Instructions are located on the last page of this paperwork. If  you have not heard from [place] regarding the results in 2 weeks, please contact this office.

## 2021-02-27 NOTE — Progress Notes (Signed)
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 [place] regarding the results in 2 weeks, please contact this office.          Signed, Merri Ray, MD [facility]

## 2021-02-28 LAB — COMPREHENSIVE METABOLIC PANEL
ALT: 12 IU/L (ref 0–32)
AST: 14 IU/L (ref 0–40)
Albumin/Globulin Ratio: 2 (ref 1.2–2.2)
Albumin: 4.4 g/dL (ref 3.8–4.9)
Alkaline Phosphatase: 97 IU/L (ref 44–121)
BUN/Creatinine Ratio: 8 — ABNORMAL LOW (ref 9–23)
BUN: 8 mg/dL (ref 6–24)
Bilirubin Total: 0.2 mg/dL (ref 0.0–1.2)
CO2: 21 mmol/L (ref 20–29)
Calcium: 9.6 mg/dL (ref 8.7–10.2)
Chloride: 105 mmol/L (ref 96–106)
Creatinine, Ser: 0.98 mg/dL (ref 0.57–1.00)
Globulin, Total: 2.2 g/dL (ref 1.5–4.5)
Glucose: 101 mg/dL — ABNORMAL HIGH (ref 65–99)
Potassium: 4 mmol/L (ref 3.5–5.2)
Sodium: 142 mmol/L (ref 134–144)
Total Protein: 6.6 g/dL (ref 6.0–8.5)
eGFR: 68 mL/min/{1.73_m2} (ref >59)

## 2021-02-28 LAB — LIPID PANEL
Chol/HDL Ratio: 4.9 ratio — ABNORMAL HIGH (ref 0.0–4.4)
Cholesterol, Total: 194 mg/dL (ref 100–199)
HDL: 40 mg/dL (ref >39)
LDL Chol Calc (NIH): 120 mg/dL — ABNORMAL HIGH (ref 0–99)
Triglycerides: 194 mg/dL — ABNORMAL HIGH (ref 0–149)
VLDL Cholesterol Cal: 34 mg/dL (ref 5–40)

## 2021-02-28 LAB — TSH: TSH: 46.3 u[IU]/mL — ABNORMAL HIGH (ref 0.450–4.500)

## 2021-03-01 ENCOUNTER — Other Ambulatory Visit: Payer: Self-pay | Admitting: Family Medicine

## 2021-03-01 ENCOUNTER — Encounter: Payer: Self-pay | Admitting: Family Medicine

## 2021-03-01 DIAGNOSIS — E039 Hypothyroidism, unspecified: Secondary | ICD-10-CM

## 2021-03-01 MED ORDER — LEVOTHYROXINE SODIUM 125 MCG PO TABS: 125.0000 ug | ORAL_TABLET | Freq: Every day | ORAL | 2 refills | Status: DC

## 2021-03-20 ENCOUNTER — Telehealth (INDEPENDENT_AMBULATORY_CARE_PROVIDER_SITE_OTHER): Payer: BC Managed Care – PPO | Admitting: Family Medicine

## 2021-03-20 ENCOUNTER — Encounter: Payer: Self-pay | Admitting: Family Medicine

## 2021-03-20 VITALS — Ht 66.0 in

## 2021-03-20 DIAGNOSIS — G4700 Insomnia, unspecified: Secondary | ICD-10-CM | POA: Diagnosis not present

## 2021-03-20 DIAGNOSIS — I10 Essential (primary) hypertension: Secondary | ICD-10-CM | POA: Diagnosis not present

## 2021-03-20 DIAGNOSIS — F418 Other specified anxiety disorders: Secondary | ICD-10-CM

## 2021-03-20 DIAGNOSIS — E039 Hypothyroidism, unspecified: Secondary | ICD-10-CM

## 2021-03-20 MED ORDER — LEVOTHYROXINE SODIUM 137 MCG PO TABS: 137.0000 ug | ORAL_TABLET | Freq: Every day | ORAL | 1 refills | Status: DC

## 2021-03-20 NOTE — Patient Instructions (Addendum)
My new practice: [facility] Address: [address] [place], [place],  [address] Phone: [phone]  Keep a record of your blood pressures outside of the office and if those are over 140/90 - please schedule visit to adjust meds further.  Higher dose of synthroid sent to your pharmacy - recheck in 6 weeks for repeat labs. Return to the clinic or go to the nearest emergency room if any of your symptoms worsen or new symptoms occur.  Glad to hear that zoloft and hydroxyzine are helping. No changes for now. Take care!   If you have lab work done today you will be contacted with your lab results within the next 2 weeks.  If you have not heard from [place] then please contact us. The fastest way to get your results is to register for My Chart.   IF you received an x-ray today, you will receive an invoice from [facility]. Please contact [facility] at [phone] with questions or concerns regarding your invoice.   IF you received labwork today, you will receive an invoice from [facility]. Please contact [facility] at [phone] with questions or concerns regarding your invoice.   Our billing staff will not be able to assist you with questions regarding bills from these companies.  You will be contacted with the lab results as soon as they are available. The fastest way to get your results is to activate your My Chart account. Instructions are located on the last page of this paperwork. If you have not heard from [place] regarding the results in 2 weeks, please contact this office.
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
## 2021-04-15 ENCOUNTER — Encounter: Payer: Self-pay | Admitting: Obstetrics and Gynecology

## 2021-04-15 ENCOUNTER — Ambulatory Visit (INDEPENDENT_AMBULATORY_CARE_PROVIDER_SITE_OTHER): Payer: BC Managed Care – PPO | Admitting: Obstetrics and Gynecology

## 2021-04-15 ENCOUNTER — Other Ambulatory Visit: Payer: Self-pay

## 2021-04-15 VITALS — BP 142/86 | Ht 66.5 in | Wt 199.0 lb

## 2021-04-15 DIAGNOSIS — Z01419 Encounter for gynecological examination (general) (routine) without abnormal findings: Secondary | ICD-10-CM

## 2021-04-15 NOTE — Patient Instructions (Signed)

## 2021-08-11 ENCOUNTER — Other Ambulatory Visit: Payer: Self-pay

## 2021-08-11 ENCOUNTER — Encounter (HOSPITAL_COMMUNITY): Payer: Self-pay

## 2021-08-11 ENCOUNTER — Ambulatory Visit (HOSPITAL_COMMUNITY)
Admission: EM | Admit: 2021-08-11 | Discharge: 2021-08-11 | Disposition: A | Discharge status: Home / Self Care | Payer: BC Managed Care – PPO | Attending: Emergency Medicine | Admitting: Emergency Medicine

## 2021-08-11 DIAGNOSIS — Z20822 Contact with and (suspected) exposure to covid-19: Secondary | ICD-10-CM | POA: Diagnosis not present

## 2021-08-11 DIAGNOSIS — B349 Viral infection, unspecified: Secondary | ICD-10-CM | POA: Diagnosis not present

## 2021-08-11 LAB — SARS CORONAVIRUS 2 (TAT 6-24 HRS): SARS Coronavirus 2: NEGATIVE

## 2021-08-11 NOTE — ED Triage Notes (Signed)
Pt c/o sore throat onset last night with associated headache, diarrhea, mild nasal congestion, body aches. Denies coughing, fever.
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
## 2021-08-11 NOTE — Discharge Instructions (Addendum)

## 2021-09-10 ENCOUNTER — Ambulatory Visit (INDEPENDENT_AMBULATORY_CARE_PROVIDER_SITE_OTHER): Payer: BC Managed Care – PPO | Admitting: Family Medicine

## 2021-09-10 ENCOUNTER — Other Ambulatory Visit: Payer: Self-pay

## 2021-09-10 ENCOUNTER — Encounter: Payer: Self-pay | Admitting: Family Medicine

## 2021-09-10 VITALS — BP 134/80 | HR 68 | Temp 98.1°F | Resp 16 | Ht 66.5 in | Wt 206.6 lb

## 2021-09-10 DIAGNOSIS — R195 Other fecal abnormalities: Secondary | ICD-10-CM

## 2021-09-10 DIAGNOSIS — R002 Palpitations: Secondary | ICD-10-CM | POA: Diagnosis not present

## 2021-09-10 DIAGNOSIS — R635 Abnormal weight gain: Secondary | ICD-10-CM

## 2021-09-10 DIAGNOSIS — M79602 Pain in left arm: Secondary | ICD-10-CM

## 2021-09-10 DIAGNOSIS — G4700 Insomnia, unspecified: Secondary | ICD-10-CM | POA: Diagnosis not present

## 2021-09-10 DIAGNOSIS — R0609 Other forms of dyspnea: Secondary | ICD-10-CM

## 2021-09-10 DIAGNOSIS — E039 Hypothyroidism, unspecified: Secondary | ICD-10-CM

## 2021-09-10 DIAGNOSIS — M79601 Pain in right arm: Secondary | ICD-10-CM

## 2021-09-10 DIAGNOSIS — R600 Localized edema: Secondary | ICD-10-CM

## 2021-09-10 DIAGNOSIS — R609 Edema, unspecified: Secondary | ICD-10-CM | POA: Diagnosis not present

## 2021-09-10 DIAGNOSIS — E785 Hyperlipidemia, unspecified: Secondary | ICD-10-CM

## 2021-09-10 DIAGNOSIS — R0600 Dyspnea, unspecified: Secondary | ICD-10-CM

## 2021-09-10 DIAGNOSIS — F418 Other specified anxiety disorders: Secondary | ICD-10-CM

## 2021-09-10 DIAGNOSIS — R5383 Other fatigue: Secondary | ICD-10-CM | POA: Diagnosis not present

## 2021-09-10 DIAGNOSIS — F4323 Adjustment disorder with mixed anxiety and depressed mood: Secondary | ICD-10-CM | POA: Diagnosis not present

## 2021-09-10 DIAGNOSIS — I10 Essential (primary) hypertension: Secondary | ICD-10-CM

## 2021-09-10 LAB — CBC
HCT: 39.4 % (ref 36.0–46.0)
Hemoglobin: 13 g/dL (ref 12.0–15.0)
MCHC: 33 g/dL (ref 30.0–36.0)
MCV: 90.4 fl (ref 78.0–100.0)
Platelets: 144 10*3/uL — ABNORMAL LOW (ref 150.0–400.0)
RBC: 4.36 Mil/uL (ref 3.87–5.11)
RDW: 15.7 % — ABNORMAL HIGH (ref 11.5–15.5)
WBC: 6.2 10*3/uL (ref 4.0–10.5)

## 2021-09-10 LAB — TSH: TSH: 3.68 u[IU]/mL (ref 0.35–5.50)

## 2021-09-10 MED ORDER — HYDROXYZINE HCL 25 MG PO TABS: 12.5000 mg | ORAL_TABLET | Freq: Three times a day (TID) | ORAL | 0 refills | Status: DC | PRN

## 2021-09-10 MED ORDER — METOPROLOL SUCCINATE ER 100 MG PO TB24: 100.0000 mg | ORAL_TABLET | Freq: Every day | ORAL | 1 refills | Status: DC

## 2021-09-10 MED ORDER — SERTRALINE HCL 25 MG PO TABS: 25.0000 mg | ORAL_TABLET | Freq: Every day | ORAL | 1 refills | Status: DC

## 2021-09-10 MED ORDER — LEVOTHYROXINE SODIUM 137 MCG PO TABS: 137.0000 ug | ORAL_TABLET | Freq: Every day | ORAL | 1 refills | Status: DC

## 2021-09-10 MED ORDER — ATORVASTATIN CALCIUM 20 MG PO TABS: 20.0000 mg | ORAL_TABLET | Freq: Every day | ORAL | 3 refills | Status: DC
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
## 2021-09-10 NOTE — Patient Instructions (Addendum)
Increase the sertraline to daily, with the hydroxyzine still as needed only. We can discuss med changes if needed depending on your symptoms.   I will check some labs for the other symptoms we discussed today.  If you have any increasing shortness of breath, especially at rest, any chest pains or worsening heart palpitations be seen through the emergency room.  Otherwise follow-up with me in the next 2 weeks and we can review labs and next steps.  Return to the clinic or go to the nearest emergency room if any of your symptoms worsen or new symptoms occur.   Shortness of Breath, Adult Shortness of breath is when a person has trouble breathing enough air or when a person feels like she or he is having trouble breathing in enough air. Shortness of breath could be a sign of a medical problem. Follow these instructions at home:  Pay attention to any changes in your symptoms. Do not use any products that contain nicotine or tobacco, such as cigarettes, e-cigarettes, and chewing tobacco. Do not smoke. Smoking is a common cause of shortness of breath. If you need help quitting, ask your health care provider. Avoid things that can irritate your airways, such as: Mold. Dust. Air pollution. Chemical fumes. Things that can cause allergy symptoms (allergens), if you have allergies. Keep your living space clean and free of mold and dust. Rest as needed. Slowly return to your usual activities. Take over-the-counter and prescription medicines only as told by your health care provider. This includes oxygen therapy and inhaled medicines. Keep all follow-up visits as told by your health care provider. This is important. Contact a health care provider if: Your condition does not improve as soon as expected. You have a hard time doing your normal activities, even after you rest. You have new symptoms. Get help right away if: Your shortness of breath gets worse. You have shortness of breath when you are  resting. You feel light-headed or you faint. You have a cough that is not controlled with medicines. You cough up blood. You have pain with breathing. You have pain in your chest, arms, shoulders, or abdomen. You have a fever. You cannot walk up stairs or exercise the way that you normally do. These symptoms may represent a serious problem that is an emergency. Do not wait to see if the symptoms will go away. Get medical help right away. Call your local emergency services (911 in the [place] Do not drive yourself to the hospital. Summary Shortness of breath is when a person has trouble breathing enough air. It can be a sign of a medical problem. Avoid things that irritate your lungs, such as smoking, pollution, mold, and dust. Pay attention to changes in your symptoms and contact your health care provider if you have a hard time completing daily activities because of shortness of breath. This information is not intended to replace advice given to you by your health care provider. Make sure you discuss any questions you have with your health care provider. Document Revised: 05/15/2018 Document Reviewed: 05/15/2018 Elsevier Patient Education  2022 [employer].

## 2021-09-11 LAB — LIPID PANEL
Cholesterol: 167 mg/dL (ref 0–200)
HDL: 41.9 mg/dL (ref >39.00)
LDL Cholesterol: 92 mg/dL (ref 0–99)
NonHDL: 124.84
Total CHOL/HDL Ratio: 4
Triglycerides: 163 mg/dL — ABNORMAL HIGH (ref 0.0–149.0)
VLDL: 32.6 mg/dL (ref 0.0–40.0)

## 2021-09-11 LAB — COMPREHENSIVE METABOLIC PANEL
ALT: 16 U/L (ref 0–35)
AST: 13 U/L (ref 0–37)
Albumin: 4.1 g/dL (ref 3.5–5.2)
Alkaline Phosphatase: 98 U/L (ref 39–117)
BUN: 14 mg/dL (ref 6–23)
CO2: 25 mEq/L (ref 19–32)
Calcium: 9.7 mg/dL (ref 8.4–10.5)
Chloride: 111 mEq/L (ref 96–112)
Creatinine, Ser: 1.34 mg/dL — ABNORMAL HIGH (ref 0.40–1.20)
GFR: 44.56 mL/min — ABNORMAL LOW (ref >60.00)
Glucose, Bld: 81 mg/dL (ref 70–99)
Potassium: 4.7 mEq/L (ref 3.5–5.1)
Sodium: 140 mEq/L (ref 135–145)
Total Bilirubin: 0.3 mg/dL (ref 0.2–1.2)
Total Protein: 6.4 g/dL (ref 6.0–8.3)

## 2021-09-11 LAB — PRO B NATRIURETIC PEPTIDE: NT-Pro BNP: 24 pg/mL (ref 0–287)

## 2021-09-24 ENCOUNTER — Encounter: Payer: Self-pay | Admitting: Family Medicine

## 2021-09-24 ENCOUNTER — Other Ambulatory Visit: Payer: Self-pay

## 2021-09-24 ENCOUNTER — Ambulatory Visit (INDEPENDENT_AMBULATORY_CARE_PROVIDER_SITE_OTHER): Payer: BC Managed Care – PPO | Admitting: Family Medicine

## 2021-09-24 VITALS — BP 136/86 | HR 86 | Temp 97.6°F | Ht 66.0 in | Wt 206.0 lb

## 2021-09-24 DIAGNOSIS — F4323 Adjustment disorder with mixed anxiety and depressed mood: Secondary | ICD-10-CM | POA: Diagnosis not present

## 2021-09-24 DIAGNOSIS — R7989 Other specified abnormal findings of blood chemistry: Secondary | ICD-10-CM | POA: Diagnosis not present

## 2021-09-24 DIAGNOSIS — R5383 Other fatigue: Secondary | ICD-10-CM
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
## 2021-09-24 NOTE — Patient Instructions (Signed)
Glad to hear you are feeling better. No change in meds at this time, but we do have option to adjust zoloft if needed. Make sure to drink plenty of fluids - water is best. Lab only visit in 2 weeks. Take care!

## 2021-10-08 ENCOUNTER — Other Ambulatory Visit: Payer: Self-pay

## 2021-10-08 ENCOUNTER — Other Ambulatory Visit (INDEPENDENT_AMBULATORY_CARE_PROVIDER_SITE_OTHER): Payer: BC Managed Care – PPO

## 2021-10-08 DIAGNOSIS — R7989 Other specified abnormal findings of blood chemistry: Secondary | ICD-10-CM

## 2021-10-08 DIAGNOSIS — E785 Hyperlipidemia, unspecified: Secondary | ICD-10-CM

## 2021-10-08 DIAGNOSIS — M79602 Pain in left arm: Secondary | ICD-10-CM

## 2021-10-08 LAB — CK: Total CK: 93 U/L (ref 7–177)

## 2021-10-08 LAB — BASIC METABOLIC PANEL
BUN: 11 mg/dL (ref 6–23)
CO2: 28 mEq/L (ref 19–32)
Calcium: 9.4 mg/dL (ref 8.4–10.5)
Chloride: 106 mEq/L (ref 96–112)
Creatinine, Ser: 1 mg/dL (ref 0.40–1.20)
GFR: 63.28 mL/min (ref >60.00)
Glucose, Bld: 91 mg/dL (ref 70–99)
Potassium: 3.8 mEq/L (ref 3.5–5.1)
Sodium: 142 mEq/L (ref 135–145)

## 2021-12-25 ENCOUNTER — Ambulatory Visit (INDEPENDENT_AMBULATORY_CARE_PROVIDER_SITE_OTHER): Payer: BC Managed Care – PPO | Admitting: Family Medicine

## 2021-12-25 VITALS — BP 128/78 | HR 79 | Temp 98.0°F | Resp 17 | Ht 66.0 in | Wt 204.6 lb

## 2021-12-25 DIAGNOSIS — R0683 Snoring: Secondary | ICD-10-CM

## 2021-12-25 DIAGNOSIS — R400 Somnolence: Secondary | ICD-10-CM

## 2021-12-25 DIAGNOSIS — G4700 Insomnia, unspecified: Secondary | ICD-10-CM | POA: Diagnosis not present

## 2021-12-25 DIAGNOSIS — F4323 Adjustment disorder with mixed anxiety and depressed mood: Secondary | ICD-10-CM

## 2021-12-25 DIAGNOSIS — F418 Other specified anxiety disorders: Secondary | ICD-10-CM

## 2021-12-25 MED ORDER — SERTRALINE HCL 25 MG PO TABS: 25.0000 mg | ORAL_TABLET | Freq: Every day | ORAL | 1 refills | Status: DC
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
## 2021-12-25 NOTE — Patient Instructions (Addendum)
Glad to hear you are doing well.  No change in sertraline dose for now.   I will refer you to a sleep specialist to evaluate for possible sleep apnea as that can sometimes cause frequent wakening, snoring,  and daytime sleepiness.  If any worsening symptoms be seen.   Do not drive or operate machinery if you are tired.

## 2021-12-26 ENCOUNTER — Encounter: Payer: Self-pay | Admitting: Family Medicine

## 2022-03-03 ENCOUNTER — Encounter: Payer: Self-pay | Admitting: Obstetrics and Gynecology

## 2022-03-08 LAB — HM MAMMOGRAPHY

## 2022-03-19 ENCOUNTER — Encounter: Payer: Self-pay | Admitting: Obstetrics and Gynecology

## 2022-03-23 ENCOUNTER — Encounter: Payer: Self-pay | Admitting: Family Medicine

## 2022-03-24 ENCOUNTER — Encounter: Payer: Self-pay | Admitting: Obstetrics and Gynecology

## 2022-03-25 ENCOUNTER — Telehealth: Payer: Self-pay

## 2022-03-25 ENCOUNTER — Ambulatory Visit (INDEPENDENT_AMBULATORY_CARE_PROVIDER_SITE_OTHER): Payer: BC Managed Care – PPO | Admitting: Family Medicine

## 2022-03-25 VITALS — BP 130/88 | HR 72 | Temp 98.0°F | Ht 66.0 in | Wt 206.4 lb

## 2022-03-25 DIAGNOSIS — F4323 Adjustment disorder with mixed anxiety and depressed mood: Secondary | ICD-10-CM

## 2022-03-25 DIAGNOSIS — I10 Essential (primary) hypertension: Secondary | ICD-10-CM

## 2022-03-25 DIAGNOSIS — R002 Palpitations: Secondary | ICD-10-CM | POA: Diagnosis not present

## 2022-03-25 DIAGNOSIS — F418 Other specified anxiety disorders: Secondary | ICD-10-CM

## 2022-03-25 DIAGNOSIS — E039 Hypothyroidism, unspecified: Secondary | ICD-10-CM | POA: Diagnosis not present

## 2022-03-25 DIAGNOSIS — M65341 Trigger finger, right ring finger: Secondary | ICD-10-CM

## 2022-03-25 DIAGNOSIS — G4700 Insomnia, unspecified: Secondary | ICD-10-CM | POA: Diagnosis not present

## 2022-03-25 DIAGNOSIS — J3089 Other allergic rhinitis: Secondary | ICD-10-CM

## 2022-03-25 DIAGNOSIS — E785 Hyperlipidemia, unspecified: Secondary | ICD-10-CM

## 2022-03-25 MED ORDER — SERTRALINE HCL 50 MG PO TABS: 50.0000 mg | ORAL_TABLET | Freq: Every day | ORAL | 1 refills | Status: DC

## 2022-03-25 MED ORDER — FLUTICASONE PROPIONATE 50 MCG/ACT NA SUSP: 2.0000 | Freq: Every day | NASAL | 6 refills | Status: DC

## 2022-03-25 MED ORDER — LEVOTHYROXINE SODIUM 137 MCG PO TABS: 137.0000 ug | ORAL_TABLET | Freq: Every day | ORAL | 1 refills | Status: DC

## 2022-03-25 MED ORDER — METOPROLOL SUCCINATE ER 100 MG PO TB24: 100.0000 mg | ORAL_TABLET | Freq: Every day | ORAL | 1 refills | Status: DC

## 2022-03-25 MED ORDER — HYDROXYZINE HCL 25 MG PO TABS: 12.5000 mg | ORAL_TABLET | Freq: Three times a day (TID) | ORAL | 0 refills | Status: DC | PRN

## 2022-03-25 NOTE — Telephone Encounter (Signed)
Pt requested refill Flonase allergy when leaving the office today, refilled as maintenance  ?
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
## 2022-03-25 NOTE — Patient Instructions (Addendum)
Keep a record of your blood pressures outside of the office and if over 140/90 let me know. Make sure to check it as recommended below. I will check labs but restart cholesterol med - should have refills available.  ?If heart palpitations return, please follow up to discuss further.  ?Increase sertraline to '50mg'$  per day ?Recheck 1 month.  ?I will refer you for finger issue - possible trigger finger.  ? ? ?Managing Anxiety, Adult ?After being diagnosed with anxiety, you may be relieved to know why you have felt or behaved a certain way. You may also feel overwhelmed about the treatment ahead and what it will mean for your life. With care and support, you can manage this condition. ?How to manage lifestyle changes ?Managing stress and anxiety ?Stress is your body's reaction to life changes and events, both good and bad. Most stress will last just a few hours, but stress can be ongoing and can lead to more than just stress. Although stress can play a major role in anxiety, it is not the same as anxiety. Stress is usually caused by something external, such as a deadline, test, or competition. Stress normally passes after the triggering event has ended.  ?Anxiety is caused by something internal, such as imagining a terrible outcome or worrying that something will go wrong that will devastate you. Anxiety often does not go away even after the triggering event is over, and it can become long-term (chronic) worry. It is important to understand the differences between stress and anxiety and to manage your stress effectively so that it does not lead to an anxious response. ?Talk with your health care provider or a counselor to learn more about reducing anxiety and stress. He or she may suggest tension reduction techniques, such as: ?Music therapy. Spend time creating or listening to music that you enjoy and that inspires you. ?Mindfulness-based meditation. Practice being aware of your normal breaths while not trying to  control your breathing. It can be done while sitting or walking. ?Centering prayer. This involves focusing on a word, phrase, or sacred image that means something to you and brings you peace. ?Deep breathing. To do this, expand your stomach and inhale slowly through your nose. Hold your breath for 3-5 seconds. Then exhale slowly, letting your stomach muscles relax. ?Self-talk. Learn to notice and identify thought patterns that lead to anxiety reactions and change those patterns to thoughts that feel peaceful. ?Muscle relaxation. Taking time to tense muscles and then relax them. ?Choose a tension reduction technique that fits your lifestyle and personality. These techniques take time and practice. Set aside 5-15 minutes a day to do them. Therapists can offer counseling and training in these techniques. The training to help with anxiety may be covered by some insurance plans. ?Other things you can do to manage stress and anxiety include: ?Keeping a stress diary. This can help you learn what triggers your reaction and then learn ways to manage your response. ?Thinking about how you react to certain situations. You may not be able to control everything, but you can control your response. ?Making time for activities that help you relax and not feeling guilty about spending your time in this way. ?Doing visual imagery. This involves imagining or creating mental pictures to help you relax. ?Practicing yoga. Through yoga poses, you can lower tension and promote relaxation. ? ?Medicines ?Medicines can help ease symptoms. Medicines for anxiety include: ?Antidepressant medicines. These are usually prescribed for long-term daily control. ?Anti-anxiety medicines. These may  be added in severe cases, especially when panic attacks occur. ?Medicines will be prescribed by a health care provider. When used together, medicines, psychotherapy, and tension reduction techniques may be the most effective  treatment. ?Relationships ?Relationships can play a big part in helping you recover. Try to spend more time connecting with trusted friends and family members. ?Consider going to couples counseling if you have a partner, taking family education classes, or going to family therapy. ?Therapy can help you and others better understand your condition. ?How to recognize changes in your anxiety ?Everyone responds differently to treatment for anxiety. Recovery from anxiety happens when symptoms decrease and stop interfering with your daily activities at home or work. This may mean that you will start to: ?Have better concentration and focus. Worry will interfere less in your daily thinking. ?Sleep better. ?Be less irritable. ?Have more energy. ?Have improved memory. ?It is also important to recognize when your condition is getting worse. Contact your health care provider if your symptoms interfere with home or work and you feel like your condition is not improving. ?Follow these instructions at home: ?Activity ?Exercise. Adults should do the following: ?Exercise for at least 150 minutes each week. The exercise should increase your heart rate and make you sweat (moderate-intensity exercise). ?Strengthening exercises at least twice a week. ?Get the right amount and quality of sleep. Most adults need 7-9 hours of sleep each night. ?Lifestyle ? ?Eat a healthy diet that includes plenty of vegetables, fruits, whole grains, low-fat dairy products, and lean protein. ?Do not eat a lot of foods that are high in fats, added sugars, or salt (sodium). ?Make choices that simplify your life. ?Do not use any products that contain nicotine or tobacco. These products include cigarettes, chewing tobacco, and vaping devices, such as e-cigarettes. If you need help quitting, ask your health care provider. ?Avoid caffeine, alcohol, and certain over-the-counter cold medicines. These may make you feel worse. Ask your pharmacist which medicines to  avoid. ?General instructions ?Take over-the-counter and prescription medicines only as told by your health care provider. ?Keep all follow-up visits. This is important. ?Where to find support ?You can get help and support from these sources: ?Self-help groups. ?Online and [employer]. ?A trusted spiritual leader. ?Couples counseling. ?Family education classes. ?Family therapy. ?Where to find more information ?You may find that joining a support group helps you deal with your anxiety. The following sources can help you locate counselors or support groups near you: ?[place]: www.mentalhealth[place].net ?Anxiety and Depression Association of America (ADAA): https://www.clark.net/ ?National Alliance on Mental Illness (NAMI): www.nami.org ?Contact a health care provider if: ?You have a hard time staying focused or finishing daily tasks. ?You spend many hours a day feeling worried about everyday life. ?You become exhausted by worry. ?You start to have headaches or frequently feel tense. ?You develop chronic nausea or diarrhea. ?Get help right away if: ?You have a racing heart and shortness of breath. ?You have thoughts of hurting yourself or others. ?If you ever feel like you may hurt yourself or others, or have thoughts about taking your own life, get help right away. Go to your nearest emergency department or: ?Call your local emergency services (911 in the [place] ?Call a suicide crisis helpline, such as the [place] at 312-158-8455 or 988 in the [place]. This is open 24 hours a day in the [place] ?Text the Crisis Text Line at (416) 006-6501 (in the [place].). ?Summary ?Taking steps to learn and use tension reduction techniques  can help calm you and help prevent triggering an anxiety reaction. ?When used together, medicines, psychotherapy, and tension reduction techniques may be the most effective treatment. ?Family, friends, and partners can play a big part in supporting you. ?This  information is not intended to replace advice given to you by your health care provider. Make sure you discuss any questions you have with your health care provider. ?Document Revised: 07/08/2021 Document Reviewed: 04/05/2021 ?Elsevier Patient Educatio

## 2022-03-26 LAB — CBC
HCT: 38.7 % (ref 36.0–46.0)
Hemoglobin: 12.7 g/dL (ref 12.0–15.0)
MCHC: 32.9 g/dL (ref 30.0–36.0)
MCV: 92.2 fl (ref 78.0–100.0)
Platelets: 149 10*3/uL — ABNORMAL LOW (ref 150.0–400.0)
RBC: 4.19 Mil/uL (ref 3.87–5.11)
RDW: 15.8 % — ABNORMAL HIGH (ref 11.5–15.5)
WBC: 5.7 10*3/uL (ref 4.0–10.5)

## 2022-03-26 LAB — LIPID PANEL
Cholesterol: 248 mg/dL — ABNORMAL HIGH (ref 0–200)
HDL: 34.7 mg/dL — ABNORMAL LOW (ref >39.00)
NonHDL: 213.32
Total CHOL/HDL Ratio: 7
Triglycerides: 214 mg/dL — ABNORMAL HIGH (ref 0.0–149.0)
VLDL: 42.8 mg/dL — ABNORMAL HIGH (ref 0.0–40.0)

## 2022-03-26 LAB — TSH: TSH: 2.06 u[IU]/mL (ref 0.35–5.50)

## 2022-03-26 LAB — COMPREHENSIVE METABOLIC PANEL
ALT: 11 U/L (ref 0–35)
AST: 11 U/L (ref 0–37)
Albumin: 4.2 g/dL (ref 3.5–5.2)
Alkaline Phosphatase: 94 U/L (ref 39–117)
BUN: 14 mg/dL (ref 6–23)
CO2: 27 mEq/L (ref 19–32)
Calcium: 9.4 mg/dL (ref 8.4–10.5)
Chloride: 106 mEq/L (ref 96–112)
Creatinine, Ser: 1.07 mg/dL (ref 0.40–1.20)
GFR: 58.16 mL/min — ABNORMAL LOW (ref >60.00)
Glucose, Bld: 76 mg/dL (ref 70–99)
Potassium: 4 mEq/L (ref 3.5–5.1)
Sodium: 140 mEq/L (ref 135–145)
Total Bilirubin: 0.4 mg/dL (ref 0.2–1.2)
Total Protein: 6.3 g/dL (ref 6.0–8.3)

## 2022-03-26 LAB — LDL CHOLESTEROL, DIRECT: Direct LDL: 161 mg/dL

## 2022-04-01 ENCOUNTER — Ambulatory Visit (INDEPENDENT_AMBULATORY_CARE_PROVIDER_SITE_OTHER): Payer: BC Managed Care – PPO | Admitting: Neurology

## 2022-04-01 ENCOUNTER — Encounter: Payer: Self-pay | Admitting: Neurology

## 2022-04-01 VITALS — BP 169/105 | HR 54 | Ht 66.0 in | Wt 204.0 lb

## 2022-04-01 DIAGNOSIS — R002 Palpitations: Secondary | ICD-10-CM

## 2022-04-01 DIAGNOSIS — G4719 Other hypersomnia: Secondary | ICD-10-CM

## 2022-04-01 DIAGNOSIS — R351 Nocturia: Secondary | ICD-10-CM

## 2022-04-01 DIAGNOSIS — E669 Obesity, unspecified: Secondary | ICD-10-CM

## 2022-04-01 DIAGNOSIS — Z820 Family history of epilepsy and other diseases of the nervous system: Secondary | ICD-10-CM

## 2022-04-01 DIAGNOSIS — R0683 Snoring: Secondary | ICD-10-CM

## 2022-04-01 DIAGNOSIS — R030 Elevated blood-pressure reading, without diagnosis of hypertension: Secondary | ICD-10-CM

## 2022-04-01 NOTE — Patient Instructions (Signed)
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
## 2022-04-08 ENCOUNTER — Other Ambulatory Visit: Payer: Self-pay

## 2022-04-08 ENCOUNTER — Telehealth: Payer: Self-pay | Admitting: Family Medicine

## 2022-04-08 DIAGNOSIS — E785 Hyperlipidemia, unspecified: Secondary | ICD-10-CM

## 2022-04-08 MED ORDER — ATORVASTATIN CALCIUM 20 MG PO TABS: 20.0000 mg | ORAL_TABLET | Freq: Every day | ORAL | 1 refills | Status: DC

## 2022-04-08 NOTE — Telephone Encounter (Signed)
Pt states she was advised by Dr.Greene to sart taking Cholesterol rx. She would like to know when something will be sent in. Please advise.  ? ?[facility] [id number], [address]  ?[place], [place] [address]  ?Phone:  [phone]  Fax:  [phone] ?

## 2022-04-21 ENCOUNTER — Ambulatory Visit: Payer: BC Managed Care – PPO | Admitting: Obstetrics and Gynecology

## 2022-04-26 ENCOUNTER — Telehealth: Payer: Self-pay

## 2022-04-26 NOTE — Telephone Encounter (Signed)
Called pt to schedule her sleep study but was unable to leave a message due to pt not having VM set up ?

## 2022-04-29 ENCOUNTER — Ambulatory Visit: Payer: BC Managed Care – PPO | Admitting: Family Medicine

## 2022-04-30 ENCOUNTER — Ambulatory Visit (INDEPENDENT_AMBULATORY_CARE_PROVIDER_SITE_OTHER): Payer: BC Managed Care – PPO | Admitting: Family Medicine

## 2022-04-30 ENCOUNTER — Encounter: Payer: Self-pay | Admitting: Family Medicine

## 2022-04-30 VITALS — BP 144/92 | HR 80 | Temp 98.2°F | Resp 18 | Ht 66.0 in | Wt 202.2 lb

## 2022-04-30 DIAGNOSIS — F4323 Adjustment disorder with mixed anxiety and depressed mood: Secondary | ICD-10-CM

## 2022-04-30 DIAGNOSIS — E785 Hyperlipidemia, unspecified: Secondary | ICD-10-CM

## 2022-04-30 DIAGNOSIS — I10 Essential (primary) hypertension: Secondary | ICD-10-CM

## 2022-04-30 MED ORDER — AMLODIPINE BESYLATE 2.5 MG PO TABS: 2.5000 mg | ORAL_TABLET | Freq: Every day | ORAL | 1 refills | Status: DC

## 2022-04-30 NOTE — Patient Instructions (Addendum)
?Glad to hear stress/anxiety is better on zoloft '50mg'$  - continue same dose.  ?Lab visit in 1 month.  ?Add amlodipine for blood pressure. Let me know if any new side effects, but I do not expect any.  ? ?Recheck 6 months, Return to the clinic or go to the nearest emergency room if any of your symptoms worsen or new symptoms occur. ? ? ? Managing Your Hypertension ?Hypertension, also called high blood pressure, is when the force of the blood pressing against the walls of the arteries is too strong. Arteries are blood vessels that carry blood from your heart throughout your body. Hypertension forces the heart to work harder to pump blood and may cause the arteries to become narrow or stiff. ?Understanding blood pressure readings ?A blood pressure reading includes a higher number over a lower number: ?The first, or top, number is called the systolic pressure. It is a measure of the pressure in your arteries as your heart beats. ?The second, or bottom number, is called the diastolic pressure. It is a measure of the pressure in your arteries as the heart relaxes. ?For most people, a normal blood pressure is below 120/80. Your personal target blood pressure may vary depending on your medical conditions, your age, and other factors. ?Blood pressure is classified into four stages. Based on your blood pressure reading, your health care provider may use the following stages to determine what type of treatment you need, if any. Systolic pressure and diastolic pressure are measured in a unit called millimeters of mercury (mmHg). ?Normal ?Systolic pressure: below 916. ?Diastolic pressure: below 80. ?Elevated ?Systolic pressure: 945-038. ?Diastolic pressure: below 80. ?Hypertension stage 1 ?Systolic pressure: 882-800. ?Diastolic pressure: 34-91. ?Hypertension stage 2 ?Systolic pressure: 791 or above. ?Diastolic pressure: 90 or above. ?How can this condition affect me? ?Managing your hypertension is very important. Over time,  hypertension can damage the arteries and decrease blood flow to parts of the body, including the brain, heart, and kidneys. Having untreated or uncontrolled hypertension can lead to: ?A heart attack. ?A stroke. ?A weakened blood vessel (aneurysm). ?Heart failure. ?Kidney damage. ?Eye damage. ?Memory and concentration problems. ?Vascular dementia. ?What actions can I take to manage this condition? ?Hypertension can be managed by making lifestyle changes and possibly by taking medicines. Your health care provider will help you make a plan to bring your blood pressure within a normal range. You may be referred for counseling on a healthy diet and physical activity. ?Nutrition ? ?Eat a diet that is high in fiber and potassium, and low in salt (sodium), added sugar, and fat. An example eating plan is called the DASH diet. DASH stands for Dietary Approaches to Stop Hypertension. To eat this way: ?Eat plenty of fresh fruits and vegetables. Try to fill one-half of your plate at each meal with fruits and vegetables. ?Eat whole grains, such as whole-wheat pasta, brown rice, or whole-grain bread. Fill about one-fourth of your plate with whole grains. ?Eat low-fat dairy products. ?Avoid fatty cuts of meat, processed or cured meats, and poultry with skin. Fill about one-fourth of your plate with lean proteins such as fish, chicken without skin, beans, eggs, and tofu. ?Avoid pre-made and processed foods. These tend to be higher in sodium, added sugar, and fat. ?Reduce your daily sodium intake. Many people with hypertension should eat less than 1,500 mg of sodium a day. ?Lifestyle ? ?Work with your health care provider to maintain a healthy body weight or to lose weight. Ask what an ideal  weight is for you. ?Get at least 30 minutes of exercise that causes your heart to beat faster (aerobic exercise) most days of the week. Activities may include walking, swimming, or biking. ?Include exercise to strengthen your muscles (resistance  exercise), such as weight lifting, as part of your weekly exercise routine. Try to do these types of exercises for 30 minutes at least 3 days a week. ?Do not use any products that contain nicotine or tobacco. These products include cigarettes, chewing tobacco, and vaping devices, such as e-cigarettes. If you need help quitting, ask your health care provider. ?Control any long-term (chronic) conditions you have, such as high cholesterol or diabetes. ?Identify your sources of stress and find ways to manage stress. This may include meditation, deep breathing, or making time for fun activities. ?Alcohol use ?Do not drink alcohol if: ?Your health care provider tells you not to drink. ?You are pregnant, may be pregnant, or are planning to become pregnant. ?If you drink alcohol: ?Limit how much you have to: ?0-1 drink a day for women. ?0-2 drinks a day for men. ?Know how much alcohol is in your drink. In the [place] one drink equals one 12 oz bottle of beer (355 mL), one 5 oz glass of wine (148 mL), or one 1? oz glass of hard liquor (44 mL). ?Medicines ?Your health care provider may prescribe medicine if lifestyle changes are not enough to get your blood pressure under control and if: ?Your systolic blood pressure is 130 or higher. ?Your diastolic blood pressure is 80 or higher. ?Take medicines only as told by your health care provider. Follow the directions carefully. Blood pressure medicines must be taken as told by your health care provider. The medicine does not work as well when you skip doses. Skipping doses also puts you at risk for problems. ?Monitoring ?Before you monitor your blood pressure: ?Do not smoke, drink caffeinated beverages, or exercise within 30 minutes before taking a measurement. ?Use the bathroom and empty your bladder (urinate). ?Sit quietly for at least 5 minutes before taking measurements. ?Monitor your blood pressure at home as told by your health care provider. To do this: ?Sit with your back  straight and supported. ?Place your feet flat on the floor. Do not cross your legs. ?Support your arm on a flat surface, such as a table. Make sure your upper arm is at heart level. ?Each time you measure, take two or three readings one minute apart and record the results. ?You may also need to have your blood pressure checked regularly by your health care provider. ?General information ?Talk with your health care provider about your diet, exercise habits, and other lifestyle factors that may be contributing to hypertension. ?Review all the medicines you take with your health care provider because there may be side effects or interactions. ?Keep all follow-up visits. Your health care provider can help you create and adjust your plan for managing your high blood pressure. ?Where to find more information ?National Heart, Lung, and Blood Institute: https://wilson-eaton.com/ ?American Heart Association: www.heart.org ?Contact a health care provider if: ?You think you are having a reaction to medicines you have taken. ?You have repeated (recurrent) headaches. ?You feel dizzy. ?You have swelling in your ankles. ?You have trouble with your vision. ?Get help right away if: ?You develop a severe headache or confusion. ?You have unusual weakness or numbness, or you feel faint. ?You have severe pain in your chest or abdomen. ?You vomit repeatedly. ?You have trouble breathing. ?These symptoms may be  an emergency. Get help right away. Call 911. ?Do not wait to see if the symptoms will go away. ?Do not drive yourself to the hospital. ?Summary ?Hypertension is when the force of blood pumping through your arteries is too strong. If this condition is not controlled, it may put you at risk for serious complications. ?Your personal target blood pressure may vary depending on your medical conditions, your age, and other factors. For most people, a normal blood pressure is less than 120/80. ?Hypertension is managed by lifestyle changes,  medicines, or both. ?Lifestyle changes to help manage hypertension include losing weight, eating a healthy, low-sodium diet, exercising more, stopping smoking, and limiting alcohol. ?This information is not intende
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
## 2022-05-06 ENCOUNTER — Telehealth: Payer: Self-pay

## 2022-05-06 NOTE — Telephone Encounter (Signed)
We have attempted to call the patient 2 times to schedule sleep study. Patient has been unavailable at the phone numbers we have on file and has not returned our calls. If patient calls back we will schedule them for their sleep study. ° °

## 2022-05-31 ENCOUNTER — Other Ambulatory Visit: Payer: BC Managed Care – PPO

## 2022-09-19 ENCOUNTER — Other Ambulatory Visit: Payer: Self-pay | Admitting: Family Medicine

## 2022-09-19 DIAGNOSIS — E039 Hypothyroidism, unspecified: Secondary | ICD-10-CM

## 2022-10-27 ENCOUNTER — Other Ambulatory Visit: Payer: Self-pay | Admitting: Family Medicine

## 2022-10-27 ENCOUNTER — Other Ambulatory Visit: Payer: Self-pay | Admitting: Lab

## 2022-10-27 DIAGNOSIS — I10 Essential (primary) hypertension: Secondary | ICD-10-CM

## 2022-10-27 MED ORDER — AMLODIPINE BESYLATE 2.5 MG PO TABS: 2.5000 mg | ORAL_TABLET | Freq: Every day | ORAL | 1 refills | Status: DC

## 2022-11-01 ENCOUNTER — Encounter: Payer: Self-pay | Admitting: Family Medicine

## 2022-11-01 ENCOUNTER — Ambulatory Visit (INDEPENDENT_AMBULATORY_CARE_PROVIDER_SITE_OTHER): Payer: BC Managed Care – PPO | Admitting: Family Medicine

## 2022-11-01 VITALS — BP 138/78 | HR 64 | Temp 98.3°F | Ht 66.0 in | Wt 206.0 lb

## 2022-11-01 DIAGNOSIS — G4700 Insomnia, unspecified: Secondary | ICD-10-CM | POA: Diagnosis not present

## 2022-11-01 DIAGNOSIS — E785 Hyperlipidemia, unspecified: Secondary | ICD-10-CM | POA: Diagnosis not present

## 2022-11-01 DIAGNOSIS — E039 Hypothyroidism, unspecified: Secondary | ICD-10-CM

## 2022-11-01 DIAGNOSIS — I10 Essential (primary) hypertension: Secondary | ICD-10-CM | POA: Diagnosis not present

## 2022-11-01 DIAGNOSIS — F418 Other specified anxiety disorders: Secondary | ICD-10-CM

## 2022-11-01 DIAGNOSIS — J3089 Other allergic rhinitis: Secondary | ICD-10-CM

## 2022-11-01 DIAGNOSIS — F4323 Adjustment disorder with mixed anxiety and depressed mood: Secondary | ICD-10-CM

## 2022-11-01 LAB — COMPREHENSIVE METABOLIC PANEL
ALT: 12 U/L (ref 0–35)
AST: 12 U/L (ref 0–37)
Albumin: 4.1 g/dL (ref 3.5–5.2)
Alkaline Phosphatase: 105 U/L (ref 39–117)
BUN: 12 mg/dL (ref 6–23)
CO2: 28 mEq/L (ref 19–32)
Calcium: 9.4 mg/dL (ref 8.4–10.5)
Chloride: 105 mEq/L (ref 96–112)
Creatinine, Ser: 1.03 mg/dL (ref 0.40–1.20)
GFR: 60.62 mL/min (ref >60.00)
Glucose, Bld: 91 mg/dL (ref 70–99)
Potassium: 4.1 mEq/L (ref 3.5–5.1)
Sodium: 140 mEq/L (ref 135–145)
Total Bilirubin: 0.5 mg/dL (ref 0.2–1.2)
Total Protein: 6.4 g/dL (ref 6.0–8.3)

## 2022-11-01 LAB — LIPID PANEL
Cholesterol: 236 mg/dL — ABNORMAL HIGH (ref 0–200)
HDL: 41 mg/dL (ref >39.00)
NonHDL: 195.25
Total CHOL/HDL Ratio: 6
Triglycerides: 202 mg/dL — ABNORMAL HIGH (ref 0.0–149.0)
VLDL: 40.4 mg/dL — ABNORMAL HIGH (ref 0.0–40.0)

## 2022-11-01 LAB — TSH: TSH: 0.1 u[IU]/mL — ABNORMAL LOW (ref 0.35–5.50)

## 2022-11-01 LAB — LDL CHOLESTEROL, DIRECT: Direct LDL: 154 mg/dL

## 2022-11-01 MED ORDER — ATORVASTATIN CALCIUM 20 MG PO TABS: 20.0000 mg | ORAL_TABLET | Freq: Every day | ORAL | 1 refills | Status: DC

## 2022-11-01 MED ORDER — LEVOTHYROXINE SODIUM 137 MCG PO TABS: 137.0000 ug | ORAL_TABLET | Freq: Every day | ORAL | 1 refills | Status: DC

## 2022-11-01 MED ORDER — SERTRALINE HCL 50 MG PO TABS: 50.0000 mg | ORAL_TABLET | Freq: Every day | ORAL | 1 refills | Status: DC

## 2022-11-01 MED ORDER — FLUTICASONE PROPIONATE 50 MCG/ACT NA SUSP: 2.0000 | Freq: Every day | NASAL | 6 refills | Status: DC

## 2022-11-01 MED ORDER — AMLODIPINE BESYLATE 5 MG PO TABS: 5.0000 mg | ORAL_TABLET | Freq: Every day | ORAL | 1 refills | Status: DC

## 2022-11-01 MED ORDER — METOPROLOL SUCCINATE ER 100 MG PO TB24: 100.0000 mg | ORAL_TABLET | Freq: Every day | ORAL | 1 refills | Status: DC

## 2022-11-01 MED ORDER — HYDROXYZINE HCL 25 MG PO TABS: 12.5000 mg | ORAL_TABLET | Freq: Three times a day (TID) | ORAL | 0 refills | Status: DC | PRN

## 2022-11-01 NOTE — Patient Instructions (Addendum)
Take 2 of the 2.72m amlodipine each day and if you tolerate that dose I sent a 591mpill to your pharmacy. No other changes for now.   Call and schedule sleep testing as soon as possible: [facility] : [phone] is info on stress. Let me know if I can help further.   Stress, Adult Stress is a normal reaction to life events. Stress is what you feel when life demands more than you are used to, or more than you think you can handle. Some stress can be useful, such as studying for a test or meeting a deadline at work. Stress that occurs too often or for too long can cause problems. Long-lasting stress is called chronic stress. Chronic stress can affect your emotional health and interfere with relationships and normal daily activities. Too much stress can weaken your body's defense system (immune system) and increase your risk for physical illness. If you already have a medical problem, stress can make it worse. What are the causes? All sorts of life events can cause stress. An event that causes stress for one person may not be stressful for someone else. Major life events, whether positive or negative, commonly cause stress. Examples include: Losing a job or starting a new job. Losing a loved one. Moving to a new town or home. Getting married or divorced. Having a baby. Getting injured or sick. Less obvious life events can also cause stress, especially if they occur day after day or in combination with each other. Examples include: Working long hours. Driving in traffic. Caring for children. Being in debt. Being in a difficult relationship. What are the signs or symptoms? Stress can cause emotional and physical symptoms and can lead to unhealthy behaviors. These include the following: Emotional symptoms Anxiety. This is feeling worried, afraid, on edge, overwhelmed, or out of control. Anger, including irritation or  impatience. Depression. This is feeling sad, down, helpless, or guilty. Trouble focusing, remembering, or making decisions. Physical symptoms Aches and pains. These may affect your head, neck, back, stomach, or other areas of your body. Tight muscles or a clenched jaw. Low energy. Trouble sleeping. Unhealthy behaviors Eating to feel better (overeating) or skipping meals. Working too much or putting off tasks. Smoking, drinking alcohol, or using drugs to feel better. How is this diagnosed? A stress disorder is diagnosed through an assessment by your health care provider. A stress disorder may be diagnosed based on: Your symptoms and any stressful life events. Your medical history. Tests to rule out other causes of your symptoms. Depending on your condition, your health care provider may refer you to a specialist for further evaluation. How is this treated?  Stress management techniques are the recommended treatment for stress. Medicine is not typically recommended for treating stress. Techniques to reduce your reaction to stressful life events include: Identifying stress. Monitor yourself for symptoms of stress and notice what causes stress for you. These skills may help you to avoid or prepare for stressful events. Managing time. Set your priorities, keep a calendar of events, and learn to say no. These actions can help you avoid taking on too much. Techniques for dealing with stress include: Rethinking the problem. Try to think realistically about stressful events rather than ignoring them or overreacting. Try to find the positives in a stressful situation rather than focusing on the negatives. Exercise. Physical exercise can release both physical and emotional tension. The key is to find a form of exercise  that you enjoy and do it regularly. Relaxation techniques. These relax the body and mind. Find one or more that you enjoy and use the techniques regularly. Examples  include: Meditation, deep breathing, or progressive relaxation techniques. Yoga or tai chi. Biofeedback, mindfulness techniques, or journaling. Listening to music, being in nature, or taking part in other hobbies. Practicing a healthy lifestyle. Eat a balanced diet, drink plenty of water, limit or avoid caffeine, and get plenty of sleep. Having a strong support network. Spend time with family, friends, or other people you enjoy being around. Express your feelings and talk things over with someone you trust. Counseling or talk therapy with a mental health provider may help if you are having trouble managing stress by yourself. Follow these instructions at home: Lifestyle  Avoid drugs. Do not use any products that contain nicotine or tobacco. These products include cigarettes, chewing tobacco, and vaping devices, such as e-cigarettes. If you need help quitting, ask your health care provider. If you drink alcohol: Limit how much you have to: 0-1 drink a day for women who are not pregnant. 0-2 drinks a day for men. Know how much alcohol is in a drink. In the [place] one drink equals one 12 oz bottle of beer (355 mL), one 5 oz glass of wine (148 mL), or one 1 oz glass of hard liquor (44 mL). Do not use alcohol or drugs to relax. Eat a balanced diet that includes fresh fruits and vegetables, whole grains, lean meats, fish, eggs, beans, and low-fat dairy. Avoid processed foods and foods high in added fat, sugar, and salt. Exercise at least 30 minutes on 5 or more days each week. Get 7-8 hours of sleep each night. General instructions  Practice stress management techniques as told by your health care provider. Drink enough fluid to keep your urine pale yellow. Take over-the-counter and prescription medicines only as told by your health care provider. Keep all follow-up visits. This is important. Contact a health care provider if: Your symptoms get worse. You have new symptoms. You feel  overwhelmed by your problems and can no longer manage them by yourself. Get help right away if: You have thoughts of hurting yourself or others. Get help right awayif you feel like you may hurt yourself or others, or have thoughts about taking your own life. Go to your nearest emergency room or: Call 911. Call the [place] at [phone] or 988. This is open 24 hours a day. Text the Crisis Text Line at 928-506-4929. Summary Stress is a normal reaction to life events. It can cause problems if it happens too often or for too long. Practicing stress management techniques is the best way to treat stress. Counseling or talk therapy with a mental health provider may help if you are having trouble managing stress by yourself. This information is not intended to replace advice given to you by your health care provider. Make sure you discuss any questions you have with your health care provider. Document Revised: 07/23/2021 Document Reviewed: 07/23/2021 Elsevier Patient Education  [place].
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
## 2022-11-02 ENCOUNTER — Other Ambulatory Visit: Payer: Self-pay | Admitting: Family Medicine

## 2022-11-02 DIAGNOSIS — E039 Hypothyroidism, unspecified: Secondary | ICD-10-CM

## 2022-11-02 MED ORDER — LEVOTHYROXINE SODIUM 125 MCG PO TABS: 125.0000 ug | ORAL_TABLET | Freq: Every day | ORAL | 1 refills | Status: DC

## 2022-11-02 NOTE — Progress Notes (Signed)
See labs 

## 2023-02-16 NOTE — Progress Notes (Signed)
Opened in error

## 2023-03-01 ENCOUNTER — Ambulatory Visit (INDEPENDENT_AMBULATORY_CARE_PROVIDER_SITE_OTHER): Payer: BC Managed Care – PPO | Admitting: Obstetrics and Gynecology

## 2023-03-01 ENCOUNTER — Encounter: Payer: Self-pay | Admitting: Obstetrics and Gynecology

## 2023-03-01 VITALS — BP 126/80 | HR 74 | Ht 66.75 in | Wt 208.0 lb

## 2023-03-01 DIAGNOSIS — Z01419 Encounter for gynecological examination (general) (routine) without abnormal findings: Secondary | ICD-10-CM | POA: Diagnosis not present

## 2023-03-01 DIAGNOSIS — Z803 Family history of malignant neoplasm of breast: Secondary | ICD-10-CM
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
## 2023-03-01 NOTE — Patient Instructions (Addendum)
Consider trying Estroven for menopausal hot flashes.   EXERCISE AND DIET:  We recommended that you start or continue a regular exercise program for good health. Regular exercise means any activity that makes your heart beat faster and makes you sweat.  We recommend exercising at least 30 minutes per day at least 3 days a week, preferably 4 or 5.  We also recommend a diet low in fat and sugar.  Inactivity, poor dietary choices and obesity can cause diabetes, heart attack, stroke, and kidney damage, among others.    ALCOHOL AND SMOKING:  Women should limit their alcohol intake to no more than 7 drinks/beers/glasses of wine (combined, not each!) per week. Moderation of alcohol intake to this level decreases your risk of breast cancer and liver damage. And of course, no recreational drugs are part of a healthy lifestyle.  And absolutely no smoking or even second hand smoke. Most people know smoking can cause heart and lung diseases, but did you know it also contributes to weakening of your bones? Aging of your skin?  Yellowing of your teeth and nails?  CALCIUM AND VITAMIN D:  Adequate intake of calcium and Vitamin D are recommended.  The recommendations for exact amounts of these supplements seem to change often, but generally speaking 600 mg of calcium (either carbonate or citrate) and 800 units of Vitamin D per day seems prudent. Certain women may benefit from higher intake of Vitamin D.  If you are among these women, your doctor will have told you during your visit.    PAP SMEARS:  Pap smears, to check for cervical cancer or precancers,  have traditionally been done yearly, although recent scientific advances have shown that most women can have pap smears less often.  However, every woman still should have a physical exam from her gynecologist every year. It will include a breast check, inspection of the vulva and vagina to check for abnormal growths or skin changes, a visual exam of the cervix, and then an  exam to evaluate the size and shape of the uterus and ovaries.  And after 58 years of age, a rectal exam is indicated to check for rectal cancers. We will also provide age appropriate advice regarding health maintenance, like when you should have certain vaccines, screening for sexually transmitted diseases, bone density testing, colonoscopy, mammograms, etc.   MAMMOGRAMS:  All women over 37 years old should have a yearly mammogram. Many facilities now offer a "3D" mammogram, which may cost around $50 extra out of pocket. If possible,  we recommend you accept the option to have the 3D mammogram performed.  It both reduces the number of women who will be called back for extra views which then turn out to be normal, and it is better than the routine mammogram at detecting truly abnormal areas.    COLONOSCOPY:  Colonoscopy to screen for colon cancer is recommended for all women at age 58.  We know, you hate the idea of the prep.  We agree, BUT, having colon cancer and not knowing it is worse!!  Colon cancer so often starts as a polyp that can be seen and removed at colonscopy, which can quite literally save your life!  And if your first colonoscopy is normal and you have no family history of colon cancer, most women don't have to have it again for 10 years.  Once every ten years, you can do something that may end up saving your life, right?  We will be happy to  help you get it scheduled when you are ready.  Be sure to check your insurance coverage so you understand how much it will cost.  It may be covered as a preventative service at no cost, but you should check your particular policy.    Mediterranean Diet A Mediterranean diet refers to food and lifestyle choices that are based on the traditions of countries located on the [employer]. It focuses on eating more fruits, vegetables, whole grains, beans, nuts, seeds, and heart-healthy fats, and eating less dairy, meat, eggs, and processed foods with added  sugar, salt, and fat. This way of eating has been shown to help prevent certain conditions and improve outcomes for people who have chronic diseases, like kidney disease and heart disease. What are tips for following this plan? Reading food labels Check the serving size of packaged foods. For foods such as rice and pasta, the serving size refers to the amount of cooked product, not dry. Check the total fat in packaged foods. Avoid foods that have saturated fat or trans fats. Check the ingredient list for added sugars, such as corn syrup. Shopping  Buy a variety of foods that offer a balanced diet, including: Fresh fruits and vegetables (produce). Grains, beans, nuts, and seeds. Some of these may be available in unpackaged forms or large amounts (in bulk). Fresh seafood. Poultry and eggs. Low-fat dairy products. Buy whole ingredients instead of prepackaged foods. Buy fresh fruits and vegetables in-season from local farmers markets. Buy plain frozen fruits and vegetables. If you do not have access to quality fresh seafood, buy precooked frozen shrimp or canned fish, such as tuna, salmon, or sardines. Stock your pantry so you always have certain foods on hand, such as olive oil, canned tuna, canned tomatoes, rice, pasta, and beans. Cooking Cook foods with extra-virgin olive oil instead of using butter or other vegetable oils. Have meat as a side dish, and have vegetables or grains as your main dish. This means having meat in small portions or adding small amounts of meat to foods like pasta or stew. Use beans or vegetables instead of meat in common dishes like chili or lasagna. Experiment with different cooking methods. Try roasting, broiling, steaming, and sauting vegetables. Add frozen vegetables to soups, stews, pasta, or rice. Add nuts or seeds for added healthy fats and plant protein at each meal. You can add these to yogurt, salads, or vegetable dishes. Marinate fish or vegetables using  olive oil, lemon juice, garlic, and fresh herbs. Meal planning Plan to eat one vegetarian meal one day each week. Try to work up to two vegetarian meals, if possible. Eat seafood two or more times a week. Have healthy snacks readily available, such as: Vegetable sticks with hummus. Greek yogurt. Fruit and nut trail mix. Eat balanced meals throughout the week. This includes: Fruit: 2-3 servings a day. Vegetables: 4-5 servings a day. Low-fat dairy: 2 servings a day. Fish, poultry, or lean meat: 1 serving a day. Beans and legumes: 2 or more servings a week. Nuts and seeds: 1-2 servings a day. Whole grains: 6-8 servings a day. Extra-virgin olive oil: 3-4 servings a day. Limit red meat and sweets to only a few servings a month. Lifestyle  Cook and eat meals together with your family, when possible. Drink enough fluid to keep your urine pale yellow. Be physically active every day. This includes: Aerobic exercise like running or swimming. Leisure activities like gardening, walking, or housework. Get 7-8 hours of sleep each night. If recommended  by your health care provider, drink red wine in moderation. This means 1 glass a day for nonpregnant women and 2 glasses a day for men. A glass of wine equals 5 oz (150 mL). What foods should I eat? Fruits Apples. Apricots. Avocado. Berries. Bananas. Cherries. Dates. Figs. Grapes. Lemons. Melon. Oranges. Peaches. Plums. Pomegranate. Vegetables Artichokes. Beets. Broccoli. Cabbage. Carrots. Eggplant. [place] beans. Chard. Kale. Spinach. Onions. Leeks. Peas. Squash. Tomatoes. Peppers. Radishes. Grains Whole-grain pasta. Brown rice. Bulgur wheat. Polenta. Couscous. Whole-wheat bread. [place]. Meats and other proteins Beans. Almonds. Sunflower seeds. Pine nuts. Peanuts. [place]. Salmon. Scallops. Shrimp. [place]. Tilapia. Clams. Oysters. Eggs. Poultry without skin. Dairy Low-fat milk. Cheese. Greek yogurt. Fats and oils Extra-virgin olive oil.  Avocado oil. Grapeseed oil. Beverages Water. Red wine. Herbal tea. Sweets and desserts Greek yogurt with honey. Baked apples. Poached pears. Trail mix. Seasonings and condiments Basil. Cilantro. Coriander. Cumin. Mint. Parsley. Sage. Rosemary. Tarragon. Garlic. Oregano. Thyme. Pepper. Balsamic vinegar. Tahini. Hummus. Tomato sauce. Olives. Mushrooms. The items listed above may not be a complete list of foods and beverages you can eat. Contact a dietitian for more information. What foods should I limit? This is a list of foods that should be eaten rarely or only on special occasions. Fruits Fruit canned in syrup. Vegetables Deep-fried potatoes (french fries). Grains Prepackaged pasta or rice dishes. Prepackaged cereal with added sugar. Prepackaged snacks with added sugar. Meats and other proteins Beef. Pork. Lamb. Poultry with skin. Hot dogs. [place] Salines. Dairy Ice cream. Sour cream. Whole milk. Fats and oils Butter. Canola oil. Vegetable oil. Beef fat (tallow). Lard. Beverages Juice. Sugar-sweetened soft drinks. Beer. Liquor and spirits. Sweets and desserts Cookies. Cakes. Pies. Candy. Seasonings and condiments Mayonnaise. Pre-made sauces and marinades. The items listed above may not be a complete list of foods and beverages you should limit. Contact a dietitian for more information. Summary The Mediterranean diet includes both food and lifestyle choices. Eat a variety of fresh fruits and vegetables, beans, nuts, seeds, and whole grains. Limit the amount of red meat and sweets that you eat. If recommended by your health care provider, drink red wine in moderation. This means 1 glass a day for nonpregnant women and 2 glasses a day for men. A glass of wine equals 5 oz (150 mL). This information is not intended to replace advice given to you by your health care provider. Make sure you discuss any questions you have with your health care provider. Document Revised: 01/18/2020 Document  Reviewed: 11/15/2019 [employer] Patient Education  [place].

## 2023-03-12 LAB — HM MAMMOGRAPHY

## 2023-03-15 ENCOUNTER — Encounter: Payer: Self-pay | Admitting: Obstetrics and Gynecology

## 2023-04-12 DIAGNOSIS — M79644 Pain in right finger(s): Secondary | ICD-10-CM | POA: Insufficient documentation

## 2023-05-04 ENCOUNTER — Encounter: Payer: Self-pay | Admitting: Family Medicine

## 2023-05-04 ENCOUNTER — Ambulatory Visit (INDEPENDENT_AMBULATORY_CARE_PROVIDER_SITE_OTHER): Payer: BC Managed Care – PPO | Admitting: Family Medicine

## 2023-05-04 VITALS — BP 116/72 | HR 75 | Temp 98.7°F | Ht 66.0 in | Wt 211.8 lb

## 2023-05-04 DIAGNOSIS — Z0000 Encounter for general adult medical examination without abnormal findings: Secondary | ICD-10-CM | POA: Diagnosis not present

## 2023-05-04 DIAGNOSIS — H6122 Impacted cerumen, left ear: Secondary | ICD-10-CM

## 2023-05-04 DIAGNOSIS — M65341 Trigger finger, right ring finger: Secondary | ICD-10-CM

## 2023-05-04 DIAGNOSIS — J3089 Other allergic rhinitis: Secondary | ICD-10-CM

## 2023-05-04 DIAGNOSIS — I10 Essential (primary) hypertension: Secondary | ICD-10-CM

## 2023-05-04 DIAGNOSIS — E785 Hyperlipidemia, unspecified: Secondary | ICD-10-CM

## 2023-05-04 DIAGNOSIS — Z131 Encounter for screening for diabetes mellitus: Secondary | ICD-10-CM

## 2023-05-04 DIAGNOSIS — Z6834 Body mass index (BMI) 34.0-34.9, adult: Secondary | ICD-10-CM

## 2023-05-04 DIAGNOSIS — G4700 Insomnia, unspecified: Secondary | ICD-10-CM

## 2023-05-04 DIAGNOSIS — E039 Hypothyroidism, unspecified: Secondary | ICD-10-CM

## 2023-05-04 DIAGNOSIS — E669 Obesity, unspecified: Secondary | ICD-10-CM

## 2023-05-04 LAB — LIPID PANEL
Cholesterol: 169 mg/dL (ref 0–200)
HDL: 40.9 mg/dL (ref >39.00)
LDL Cholesterol: 96 mg/dL (ref 0–99)
NonHDL: 127.85
Total CHOL/HDL Ratio: 4
Triglycerides: 160 mg/dL — ABNORMAL HIGH (ref 0.0–149.0)
VLDL: 32 mg/dL (ref 0.0–40.0)

## 2023-05-04 LAB — COMPREHENSIVE METABOLIC PANEL
ALT: 33 U/L (ref 0–35)
AST: 24 U/L (ref 0–37)
Albumin: 4.3 g/dL (ref 3.5–5.2)
Alkaline Phosphatase: 110 U/L (ref 39–117)
BUN: 11 mg/dL (ref 6–23)
CO2: 28 mEq/L (ref 19–32)
Calcium: 9.9 mg/dL (ref 8.4–10.5)
Chloride: 105 mEq/L (ref 96–112)
Creatinine, Ser: 0.98 mg/dL (ref 0.40–1.20)
GFR: 64.12 mL/min (ref >60.00)
Glucose, Bld: 101 mg/dL — ABNORMAL HIGH (ref 70–99)
Potassium: 4.4 mEq/L (ref 3.5–5.1)
Sodium: 140 mEq/L (ref 135–145)
Total Bilirubin: 0.4 mg/dL (ref 0.2–1.2)
Total Protein: 7.1 g/dL (ref 6.0–8.3)

## 2023-05-04 LAB — HEMOGLOBIN A1C: Hgb A1c MFr Bld: 6.3 % (ref 4.6–6.5)

## 2023-05-04 LAB — TSH: TSH: 0.43 u[IU]/mL (ref 0.35–5.50)

## 2023-05-04 MED ORDER — AMLODIPINE BESYLATE 5 MG PO TABS: 5.0000 mg | ORAL_TABLET | Freq: Every day | ORAL | 1 refills | Status: DC

## 2023-05-04 MED ORDER — LEVOTHYROXINE SODIUM 125 MCG PO TABS: 125.0000 ug | ORAL_TABLET | Freq: Every day | ORAL | 1 refills | Status: DC

## 2023-05-04 MED ORDER — METOPROLOL SUCCINATE ER 100 MG PO TB24: 100.0000 mg | ORAL_TABLET | Freq: Every day | ORAL | 1 refills | Status: DC

## 2023-05-04 MED ORDER — FLUTICASONE PROPIONATE 50 MCG/ACT NA SUSP
2.0000 | Freq: Every day | NASAL | 6 refills | Status: DC
Start: 2023-05-04 — End: 2024-06-20

## 2023-05-04 MED ORDER — ATORVASTATIN CALCIUM 20 MG PO TABS: 20.0000 mg | ORAL_TABLET | Freq: Every day | ORAL | 1 refills | Status: DC

## 2023-05-04 MED ORDER — HYDROXYZINE HCL 25 MG PO TABS
12.5000 mg | ORAL_TABLET | Freq: Three times a day (TID) | ORAL | 0 refills | Status: DC | PRN
Start: 2023-05-04 — End: 2024-06-20

## 2023-05-04 NOTE — Progress Notes (Signed)
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 your health care provider if you have any questions or concerns. How do I get ready to quit? When you decide to quit smoking, create a plan to help you succeed. Before you quit: Pick a date to quit. Set a date within the next 2 weeks to give you time to prepare. Write down the reasons why you are quitting. Keep this list in places where you will see it often. Tell your family, friends, and co-workers that you are quitting. Support from people you are close to can make quitting easier. Talk with your health care provider about your options for quitting smoking. Find out what treatment options are covered by your health insurance. Identify people, places, things, and activities that make you want to smoke (triggers). Avoid them. What first steps can I take to quit  smoking? Throw away all cigarettes at home, at work, and in your car. Throw away smoking accessories, such as Set designer. Clean your car. Make sure to empty the ashtray. Clean your home, including curtains and carpets. What strategies can I use to quit smoking? Talk with your health care provider about combining strategies, such as taking medicines while you are also receiving in-person counseling. Using these two strategies together makes you more likely to succeed in quitting than if you used either strategy on its own. If you are pregnant or breastfeeding, talk with your health care provider about finding counseling or other support strategies to quit smoking. Do not take medicine to help you quit smoking unless your health care provider tells you to. Quit right away Quit smoking completely, instead of gradually reducing how much you smoke over a period of time. Stopping smoking right away may be more successful than gradually quitting. Attend in-person counseling to help you build problem-solving skills. You are more likely to succeed in quitting if you attend counseling sessions regularly. Even short sessions of 10 minutes can be effective. Take medicine You may take medicines to help you quit smoking. Some medicines require a prescription. You can also purchase over-the-counter medicines. Medicines may have  nicotine in them to replace the nicotine in cigarettes. Medicines may: Help to stop cravings. Help to relieve withdrawal symptoms. Your health care provider may recommend: Nicotine patches, gum, or lozenges. Nicotine inhalers or sprays. Non-nicotine medicine that you take by mouth. Find resources Find resources and support systems that can help you quit smoking and remain smoke-free after you quit. These resources are most helpful when you use them often. They include: Online chats with a Veterinary surgeon. Telephone quitlines. Printed Materials engineer. Support groups or group  counseling. Text messaging programs. Mobile phone apps or applications. Use apps that can help you stick to your quit plan by providing reminders, tips, and encouragement. Examples of free services include Quit Guide from the CDC and smokefree.gov  What can I do to make it easier to quit?  Reach out to your family and friends for support and encouragement. Call telephone quitlines, such as 1-800-QUIT-NOW, reach out to support groups, or work with a counselor for support. Ask people who smoke to avoid smoking around you. Avoid places that trigger you to smoke, such as bars, parties, or smoke-break areas at work. Spend time with people who do not smoke. Lessen the stress in your life. Stress can be a smoking trigger for some people. To lessen stress, try: Exercising regularly. Doing deep-breathing exercises. Doing yoga. Meditating. What benefits will I see if I quit smoking? Over time, you should start to see positive results, such as: Improved sense of smell and taste. Decreased coughing and sore throat. Slower heart rate. Lower blood pressure. Clearer and healthier skin. The ability to breathe more easily. Fewer sick days. Summary Quitting smoking can be very challenging. Do not get discouraged if you are not successful the first time. Some people need to make many attempts to quit before they achieve long-term success. When you decide to quit smoking, create a plan to help you succeed. Quit smoking right away, not slowly over a period of time. Find resources and support systems that can help you quit smoking and remain smoke-free after you quit. This information is not intended to replace advice given to you by your health care provider. Make sure you discuss any questions you have with your health care provider. Document Revised: 12/04/2021 Document Reviewed: 12/04/2021 Elsevier Patient Education  2023 Elsevier Inc.      Signed,   Meredith Staggers, MD [facility],  [facility] 05/04/23 8:16 AM

## 2023-05-04 NOTE — Patient Instructions (Addendum)
Thanks for coming in today.  Schedule eye doctor and dentist visits.  Please let me know if I can help with quitting smoking.  Continue to try to avoid sodas or other sugar beverages. Try to avoid fast food, and watch portions. Continue walking for exercise but follow up to discuss back symptoms and plan further.  I will refer you to the other hand specialist I am sorry we were unable to remove the cerumen from the ear but it is likely soften up at this point where you can try over-the-counter Debrox.  If that is ineffective we can attempt ear lavage again at your next visit. If there are other concerns we are not able to address today, please let Megan Bullock know at your next visit or follow-up sooner if needed.  Take care.   Earwax Buildup, Adult The ears produce a substance called earwax that helps keep bacteria out of the ear and protects the skin in the ear canal. Occasionally, earwax can build up in the ear and cause discomfort or hearing loss. What are the causes? This condition is caused by a buildup of earwax. Ear canals are self-cleaning. Ear wax is made in the outer part of the ear canal and generally falls out in small amounts over time. When the self-cleaning mechanism is not working, earwax builds up and can cause decreased hearing and discomfort. Attempting to clean ears with cotton swabs can push the earwax deep into the ear canal and cause decreased hearing and pain. What increases the risk? This condition is more likely to develop in people who: Clean their ears often with cotton swabs. Pick at their ears. Use earplugs or in-ear headphones often, or wear hearing aids. The following factors may also make you more likely to develop this condition: Being female. Being of older age. Naturally producing more earwax. Having narrow ear canals. Having earwax that is overly thick or sticky. Having excess hair in the ear canal. Having eczema. Being dehydrated. What are the signs or  symptoms? Symptoms of this condition include: Reduced or muffled hearing. A feeling of fullness in the ear or feeling that the ear is plugged. Fluid coming from the ear. Ear pain or an itchy ear. Ringing in the ear. Coughing. Balance problems. An obvious piece of earwax that can be seen inside the ear canal. How is this diagnosed? This condition may be diagnosed based on: Your symptoms. Your medical history. An ear exam. During the exam, your health care provider will look into your ear with an instrument called an otoscope. You may have tests, including a hearing test. How is this treated? This condition may be treated by: Using ear drops to soften the earwax. Having the earwax removed by a health care provider. The health care provider may: Flush the ear with water. Use an instrument that has a loop on the end (curette). Use a suction device. Having surgery to remove the wax buildup. This may be done in severe cases. Follow these instructions at home:  Take over-the-counter and prescription medicines only as told by your health care provider. Do not put any objects, including cotton swabs, into your ear. You can clean the opening of your ear canal with a washcloth or facial tissue. Follow instructions from your health care provider about cleaning your ears. Do not overclean your ears. Drink enough fluid to keep your urine pale yellow. This will help to thin the earwax. Keep all follow-up visits as told. If earwax builds up in your ears often  or if you use hearing aids, consider seeing your health care provider for routine, preventive ear cleanings. Ask your health care provider how often you should schedule your cleanings. If you have hearing aids, clean them according to instructions from the manufacturer and your health care provider. Contact a health care provider if: You have ear pain. You develop a fever. You have pus or other fluid coming from your ear. You have hearing  loss. You have ringing in your ears that does not go away. You feel like the room is spinning (vertigo). Your symptoms do not improve with treatment. Get help right away if: You have bleeding from the affected ear. You have severe ear pain. Summary Earwax can build up in the ear and cause discomfort or hearing loss. The most common symptoms of this condition include reduced or muffled hearing, a feeling of fullness in the ear, or feeling that the ear is plugged. This condition may be diagnosed based on your symptoms, your medical history, and an ear exam. This condition may be treated by using ear drops to soften the earwax or by having the earwax removed by a health care provider. Do not put any objects, including cotton swabs, into your ear. You can clean the opening of your ear canal with a washcloth or facial tissue. This information is not intended to replace advice given to you by your health care provider. Make sure you discuss any questions you have with your health care provider. Document Revised: 04/01/2020 Document Reviewed: 04/01/2020 Elsevier Patient Education  2023 [employer].    Preventive Care 21-46 Years Old, Female Preventive care refers to lifestyle choices and visits with your health care provider that can promote health and wellness. Preventive care visits are also called wellness exams. What can I expect for my preventive care visit? Counseling Your health care provider may ask you questions about your: Medical history, including: Past medical problems. Family medical history. Pregnancy history. Current health, including: Menstrual cycle. Method of birth control. Emotional well-being. Home life and relationship well-being. Sexual activity and sexual health. Lifestyle, including: Alcohol, nicotine or tobacco, and drug use. Access to firearms. Diet, exercise, and sleep habits. Work and work Astronomer. Sunscreen use. Safety issues such as seatbelt and  bike helmet use. Physical exam Your health care provider will check your: Height and weight. These may be used to calculate your BMI (body mass index). BMI is a measurement that tells if you are at a healthy weight. Waist circumference. This measures the distance around your waistline. This measurement also tells if you are at a healthy weight and may help predict your risk of certain diseases, such as type 2 diabetes and high blood pressure. Heart rate and blood pressure. Body temperature. Skin for abnormal spots. What immunizations do I need?  Vaccines are usually given at various ages, according to a schedule. Your health care provider will recommend vaccines for you based on your age, medical history, and lifestyle or other factors, such as travel or where you work. What tests do I need? Screening Your health care provider may recommend screening tests for certain conditions. This may include: Lipid and cholesterol levels. Diabetes screening. This is done by checking your blood sugar (glucose) after you have not eaten for a while (fasting). Pelvic exam and Pap test. Hepatitis B test. Hepatitis C test. HIV (human immunodeficiency virus) test. STI (sexually transmitted infection) testing, if you are at risk. Lung cancer screening. Colorectal cancer screening. Mammogram. Talk with your health care provider  about when you should start having regular mammograms. This may depend on whether you have a family history of breast cancer. BRCA-related cancer screening. This may be done if you have a family history of breast, ovarian, tubal, or peritoneal cancers. Bone density scan. This is done to screen for osteoporosis. Talk with your health care provider about your test results, treatment options, and if necessary, the need for more tests. Follow these instructions at home: Eating and drinking  Eat a diet that includes fresh fruits and vegetables, whole grains, lean protein, and low-fat  dairy products. Take vitamin and mineral supplements as recommended by your health care provider. Do not drink alcohol if: Your health care provider tells you not to drink. You are pregnant, may be pregnant, or are planning to become pregnant. If you drink alcohol: Limit how much you have to 0-1 drink a day. Know how much alcohol is in your drink. In the [place] one drink equals one 12 oz bottle of beer (355 mL), one 5 oz glass of wine (148 mL), or one 1 oz glass of hard liquor (44 mL). Lifestyle Brush your teeth every morning and night with fluoride toothpaste. Floss one time each day. Exercise for at least 30 minutes 5 or more days each week. Do not use any products that contain nicotine or tobacco. These products include cigarettes, chewing tobacco, and vaping devices, such as e-cigarettes. If you need help quitting, ask your health care provider. Do not use drugs. If you are sexually active, practice safe sex. Use a condom or other form of protection to prevent STIs. If you do not wish to become pregnant, use a form of birth control. If you plan to become pregnant, see your health care provider for a prepregnancy visit. Take aspirin only as told by your health care provider. Make sure that you understand how much to take and what form to take. Work with your health care provider to find out whether it is safe and beneficial for you to take aspirin daily. Find healthy ways to manage stress, such as: Meditation, yoga, or listening to music. Journaling. Talking to a trusted person. Spending time with friends and family. Minimize exposure to UV radiation to reduce your risk of skin cancer. Safety Always wear your seat belt while driving or riding in a vehicle. Do not drive: If you have been drinking alcohol. Do not ride with someone who has been drinking. When you are tired or distracted. While texting. If you have been using any mind-altering substances or drugs. Wear a helmet and other  protective equipment during sports activities. If you have firearms in your house, make sure you follow all gun safety procedures. Seek help if you have been physically or sexually abused. What's next? Visit your health care provider once a year for an annual wellness visit. Ask your health care provider how often you should have your eyes and teeth checked. Stay up to date on all vaccines. This information is not intended to replace advice given to you by your health care provider. Make sure you discuss any questions you have with your health care provider. Document Revised: 06/10/2021 Document Reviewed: 06/10/2021 Elsevier Patient Education  2023 Elsevier Inc.   Steps to Quit Smoking Smoking tobacco is the leading cause of preventable death. It can affect almost every organ in the body. Smoking puts you and those around you at risk for developing many serious chronic diseases. Quitting smoking can be very challenging. Do not get discouraged if you  are not successful the first time. Some people need to make many attempts to quit before they achieve long-term success. Do your best to stick to your quit plan, and talk with your health care provider if you have any questions or concerns. How do I get ready to quit? When you decide to quit smoking, create a plan to help you succeed. Before you quit: Pick a date to quit. Set a date within the next 2 weeks to give you time to prepare. Write down the reasons why you are quitting. Keep this list in places where you will see it often. Tell your family, friends, and co-workers that you are quitting. Support from people you are close to can make quitting easier. Talk with your health care provider about your options for quitting smoking. Find out what treatment options are covered by your health insurance. Identify people, places, things, and activities that make you want to smoke (triggers). Avoid them. What first steps can I take to quit  smoking? Throw away all cigarettes at home, at work, and in your car. Throw away smoking accessories, such as Set designer. Clean your car. Make sure to empty the ashtray. Clean your home, including curtains and carpets. What strategies can I use to quit smoking? Talk with your health care provider about combining strategies, such as taking medicines while you are also receiving in-person counseling. Using these two strategies together makes you more likely to succeed in quitting than if you used either strategy on its own. If you are pregnant or breastfeeding, talk with your health care provider about finding counseling or other support strategies to quit smoking. Do not take medicine to help you quit smoking unless your health care provider tells you to. Quit right away Quit smoking completely, instead of gradually reducing how much you smoke over a period of time. Stopping smoking right away may be more successful than gradually quitting. Attend in-person counseling to help you build problem-solving skills. You are more likely to succeed in quitting if you attend counseling sessions regularly. Even short sessions of 10 minutes can be effective. Take medicine You may take medicines to help you quit smoking. Some medicines require a prescription. You can also purchase over-the-counter medicines. Medicines may have nicotine in them to replace the nicotine in cigarettes. Medicines may: Help to stop cravings. Help to relieve withdrawal symptoms. Your health care provider may recommend: Nicotine patches, gum, or lozenges. Nicotine inhalers or sprays. Non-nicotine medicine that you take by mouth. Find resources Find resources and support systems that can help you quit smoking and remain smoke-free after you quit. These resources are most helpful when you use them often. They include: Online chats with a Veterinary surgeon. Telephone quitlines. Printed Materials engineer. Support groups or group  counseling. Text messaging programs. Mobile phone apps or applications. Use apps that can help you stick to your quit plan by providing reminders, tips, and encouragement. Examples of free services include Quit Guide from the CDC and smokefree.gov  What can I do to make it easier to quit?  Reach out to your family and friends for support and encouragement. Call telephone quitlines, such as 1-800-QUIT-NOW, reach out to support groups, or work with a counselor for support. Ask people who smoke to avoid smoking around you. Avoid places that trigger you to smoke, such as bars, parties, or smoke-break areas at work. Spend time with people who do not smoke. Lessen the stress in your life. Stress can be a smoking trigger for some  people. To lessen stress, try: Exercising regularly. Doing deep-breathing exercises. Doing yoga. Meditating. What benefits will I see if I quit smoking? Over time, you should start to see positive results, such as: Improved sense of smell and taste. Decreased coughing and sore throat. Slower heart rate. Lower blood pressure. Clearer and healthier skin. The ability to breathe more easily. Fewer sick days. Summary Quitting smoking can be very challenging. Do not get discouraged if you are not successful the first time. Some people need to make many attempts to quit before they achieve long-term success. When you decide to quit smoking, create a plan to help you succeed. Quit smoking right away, not slowly over a period of time. Find resources and support systems that can help you quit smoking and remain smoke-free after you quit. This information is not intended to replace advice given to you by your health care provider. Make sure you discuss any questions you have with your health care provider. Document Revised: 12/04/2021 Document Reviewed: 12/04/2021 Elsevier Patient Education  2023 [employer].

## 2023-05-09 ENCOUNTER — Other Ambulatory Visit: Payer: Self-pay

## 2023-05-09 ENCOUNTER — Ambulatory Visit (HOSPITAL_COMMUNITY)
Admission: EM | Admit: 2023-05-09 | Discharge: 2023-05-09 | Disposition: A | Discharge status: Home / Self Care | Payer: BC Managed Care – PPO | Attending: Emergency Medicine | Admitting: Emergency Medicine

## 2023-05-09 ENCOUNTER — Ambulatory Visit (INDEPENDENT_AMBULATORY_CARE_PROVIDER_SITE_OTHER): Payer: BC Managed Care – PPO

## 2023-05-09 ENCOUNTER — Encounter (HOSPITAL_COMMUNITY): Payer: Self-pay | Admitting: Emergency Medicine

## 2023-05-09 DIAGNOSIS — S99922A Unspecified injury of left foot, initial encounter: Secondary | ICD-10-CM

## 2023-05-09 MED ORDER — IBUPROFEN 800 MG PO TABS: 800.0000 mg | ORAL_TABLET | Freq: Three times a day (TID) | ORAL | 0 refills | Status: DC

## 2023-05-09 NOTE — ED Triage Notes (Signed)
Stumped left great toe on a piece of furniture last night.  Patient points to left great toe as painful and and area of foot behind this toe on top of foot.  Bruising to great toe.    Has not had any medications for symptoms
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
## 2023-05-09 NOTE — Discharge Instructions (Addendum)
Rest - try to avoid heavy lifting and high impact activity Ice - apply for 10-15 minutes a few times daily (with towel or blanket between foot and ice) Elevation - prop up on a pillow  Take ibuprofen and/or tylenol every 4-6 hours for pain and inflammation  You should notice improvement over the next few days Please follow with orthopedics if symptoms persist over a week

## 2023-05-20 ENCOUNTER — Ambulatory Visit (INDEPENDENT_AMBULATORY_CARE_PROVIDER_SITE_OTHER): Payer: BC Managed Care – PPO | Admitting: Family Medicine

## 2023-05-20 ENCOUNTER — Encounter: Payer: Self-pay | Admitting: Family Medicine

## 2023-05-20 VITALS — BP 128/76 | HR 77 | Temp 98.4°F | Ht 66.0 in | Wt 212.4 lb

## 2023-05-20 DIAGNOSIS — M533 Sacrococcygeal disorders, not elsewhere classified: Secondary | ICD-10-CM

## 2023-05-20 DIAGNOSIS — M79672 Pain in left foot: Secondary | ICD-10-CM | POA: Diagnosis not present

## 2023-05-20 DIAGNOSIS — M5441 Lumbago with sciatica, right side: Secondary | ICD-10-CM | POA: Diagnosis not present

## 2023-05-20 DIAGNOSIS — M7061 Trochanteric bursitis, right hip: Secondary | ICD-10-CM

## 2023-05-20 DIAGNOSIS — G8929 Other chronic pain: Secondary | ICD-10-CM

## 2023-05-20 MED ORDER — MELOXICAM 7.5 MG PO TABS
7.5000 mg | ORAL_TABLET | Freq: Every day | ORAL | 0 refills | Status: DC
Start: 2023-05-20 — End: 2023-06-27

## 2023-05-20 NOTE — Patient Instructions (Addendum)
For the hand, trigger finger, we did refer you to Dr. Merlyn Lot.  If you have not received a call for an appointment, please contact their office:  [facility] [address] [place], [place] [address] [phone]   For back pain I will refer you to physical therapy again, can take meloxicam once per day for the next week or 2 consistently, then only as needed.  Do not combine meloxicam with any other pain medicines over-the-counter except Tylenol.  Watch for any new abdominal pain or dark stools with the use of this medicine and your other medicines but I do not expect that to happen.  If it does be seen.  See information below regarding chronic back pain.  I think you may have pain coming from at a few different areas including SI joint of the back, bursa on the side of the hip, and possible radiating pain from the low back.  Follow-up in 6 weeks, sooner if any new or worsening pain or if therapy is making things worse and I am happy to refer you to a back specialist sooner than later.  Hope you feel better soon.  Take care.   Chronic Back Pain Chronic back pain is back pain that lasts longer than 3 months. The cause of your back pain may not be known. Some common causes include: Wear and tear (degenerative disease) of the bones, disks, or tissues that connect bones to each other (ligaments) in your back. Inflammation and stiffness in your back (arthritis). If you have chronic back pain, you may have times when the pain is more intense (flare-ups). You can also learn to manage the pain with home care. Follow these instructions at home: Watch for any changes in your symptoms. Take these actions to help with your pain: Managing pain and stiffness     If told, put ice on the painful area. You may be told to apply ice for the first 24-48 hours after a flare-up starts. Put ice in a plastic bag. Place a towel between your skin and the bag. Leave the ice on for 20 minutes, 2-3 times per  day. If told, apply heat to the affected area as often as told by your health care provider. Use the heat source that your provider recommends, such as a moist heat pack or a heating pad. Place a towel between your skin and the heat source. Leave the heat on for 20-30 minutes. If your skin turns bright red, remove the ice or heat right away to prevent skin damage. The risk of damage is higher if you cannot feel pain, heat, or cold. Try soaking in a warm tub. Activity        Avoid bending and other activities that make the pain worse. Have good posture when you stand or sit. When you stand, keep your upper back and neck straight, with your shoulders pulled back. Avoid slouching. When you sit, keep your back straight. Relax your shoulders. Do not round your shoulders or pull them backward. Do not sit or stand in one place for too long. Take brief periods of rest during the day. This will reduce your pain. Resting in a lying or standing position is often better than sitting to rest. When you rest for longer periods, mix in some mild activity or stretching between periods of rest. This will help to prevent stiffness and pain. Get regular exercise. Ask your provider what activities are safe for you. You may have to avoid lifting.  Ask your provider how much you can safely lift. If you do lift, always use the right technique. This means you should: Bend your knees. Keep the load close to your body. Avoid twisting. Medicines Take over-the-counter and prescription medicines only as told by your provider. You may need to take medicines for pain and inflammation. These may be taken by mouth or put on the skin. You may also be given muscle relaxants. Ask your provider if the medicine prescribed to you: Requires you to avoid driving or using machinery. Can cause constipation. You may need to take these actions to prevent or treat constipation: Drink enough fluid to keep your pee (urine) pale  yellow. Take over-the-counter or prescription medicines. Eat foods that are high in fiber, such as beans, whole grains, and fresh fruits and vegetables. Limit foods that are high in fat and processed sugars, such as fried or sweet foods. General instructions  Sleep on a firm mattress in a comfortable position. Try lying on your side with your knees slightly bent. If you lie on your back, put a pillow under your knees. Do not use any products that contain nicotine or tobacco. These products include cigarettes, chewing tobacco, and vaping devices, such as e-cigarettes. If you need help quitting, ask your provider. Contact a health care provider if: You have pain that does not get better with rest or medicine. You have new pain. You have a fever. You lose weight quickly. You have trouble doing your normal activities. You feel weak or numb in one or both of your legs or feet. Get help right away if: You are not able to control when you pee or poop. You have severe back pain and: Nausea or vomiting. Pain in your chest or abdomen. Shortness of breath. You faint. These symptoms may be an emergency. Get help right away. Call 911. Do not wait to see if the symptoms will go away. Do not drive yourself to the hospital. This information is not intended to replace advice given to you by your health care provider. Make sure you discuss any questions you have with your health care provider. Document Revised: 08/02/2022 Document Reviewed: 08/02/2022 Elsevier Patient Education  2024 [employer]   Sacroiliac Joint Dysfunction  Sacroiliac joint dysfunction is a condition that causes inflammation on one or both sides of the sacroiliac (SI) joint. The SI joint is the joint between two bones of the pelvis called the sacrum and the ilium. The sacrum is the bone at the base of the spine. The ilium is the large bone that forms the hip. This condition causes deep aching or burning pain in the low back. In  some cases, the pain may also spread into one or both buttocks, hips, or thighs. What are the causes? This condition may be caused by: Pregnancy. During pregnancy, extra stress is put on the SI joints because the pelvis widens. Injury, such as: Injuries from car crashes. Sports-related injuries. Work-related injuries. Having one leg that is shorter than the other. Conditions that affect the joints, such as: Rheumatoid arthritis. Gout. Psoriatic arthritis. Joint infection (septic arthritis). Sometimes, the cause of SI joint dysfunction is not known. What are the signs or symptoms? Symptoms of this condition include: Aching or burning pain in the lower back. The pain may also spread to other areas, such as: Buttocks. Groin. Thighs. Muscle spasms in or around the painful areas. Increased pain when standing, walking, running, stair climbing, bending, or lifting. How is this diagnosed? This condition is  diagnosed with a physical exam and your medical history. During the exam, the health care provider may move one or both of your legs to different positions to check for pain. Various tests may be done to confirm the diagnosis, including: Imaging tests to look for other causes of pain. These may include: MRI. CT scan. Bone scan. Diagnostic injection. A numbing medicine is injected into the SI joint using a needle. If your pain is temporarily improved or stopped after the injection, this can indicate that SI joint dysfunction is the problem. How is this treated? Treatment depends on the cause and severity of your condition. Treatment options can be noninvasive and may include: Ice or heat applied to the lower back area after an injury. This may help reduce pain and muscle spasms. Medicines to relieve pain or inflammation or to relax the muscles. Wearing a back brace (sacroiliac brace) to help support the joint while your back is healing. Physical therapy to increase muscle strength around  the joint and flexibility at the joint. This may also involve learning proper body positions and ways of moving to relieve stress on the joint. Direct manipulation of the SI joint. Use of a device that provides electrical stimulation to help reduce pain at the joint. Other treatments may include: Injections of steroid medicine into the joint to reduce pain and swelling. Radiofrequency ablation. This treatment uses heat to burn away nerves that are carrying pain messages from the joint. Surgery to put in screws and plates that limit or prevent joint motion. This is rare. Follow these instructions at home: Medicines Take over-the-counter and prescription medicines only as told by your health care provider. Ask your health care provider if the medicine prescribed to you: Requires you to avoid driving or using machinery. Can cause constipation. You may need to take these actions to prevent or treat constipation: Drink enough fluid to keep your urine pale yellow. Take over-the-counter or prescription medicines. Eat foods that are high in fiber, such as beans, whole grains, and fresh fruits and vegetables. Limit foods that are high in fat and processed sugars, such as fried or sweet foods. If you have a brace: Wear the brace as told by your health care provider. Remove it only as told by your health care provider. Keep the brace clean. If the brace is not waterproof: Do not let it get wet. Cover it with a watertight covering when you take a bath or a shower. Managing pain, stiffness, and swelling     Icing can help with pain and swelling. Heat may help with muscle tension or spasms. Ask your health care provider if you should use ice or heat. If directed, put ice on the affected area: If you have a removable brace, remove it as told by your health care provider. Put ice in a plastic bag. Place a towel between your skin and the bag. Leave the ice on for 20 minutes, 2-3 times a day. Remove  the ice if your skin turns bright red. This is very important. If you cannot feel pain, heat, or cold, you have a greater risk of damage to the area. If directed, apply heat to the affected area as often as told by your health care provider. Use the heat source that your health care provider recommends, such as a moist heat pack or a heating pad. Place a towel between your skin and the heat source. Leave the heat on for 20-30 minutes. Remove the heat if your skin turns bright  red. This is especially important if you are unable to feel pain, heat, or cold. You may have a greater risk of getting burned. General instructions Rest as needed. Return to your normal activities as told by your health care provider. Ask your health care provider what activities are safe for you. Do exercises as told by your health care provider or physical therapist. Keep all follow-up visits. This is important. Contact a health care provider if: Your pain is not controlled with medicine. You have a fever. Your pain is getting worse. Get help right away if: You have weakness, numbness, or tingling in your legs or feet. You lose control of your bladder or bowels. Summary Sacroiliac (SI) joint dysfunction is a condition that causes inflammation on one or both sides of the SI joint. This condition causes deep aching or burning pain in the low back. In some cases, the pain may also spread into one or both buttocks, hips, or thighs. Treatment depends on the cause and severity of your condition. It may include medicines to reduce pain and swelling or to relax muscles. This information is not intended to replace advice given to you by your health care provider. Make sure you discuss any questions you have with your health care provider. Document Revised: 04/24/2020 Document Reviewed: 04/24/2020 Elsevier Patient Education  2024 [employer]   Hip Bursitis  Hip bursitis is the swelling of one or more of the fluid-filled  sacs (bursae) in the hip joint. The hip bursae absorb shocks and prevent bones from rubbing against each other. If a bursa becomes irritated, it can fill with extra fluid and become inflamed. Hip bursitis can cause mild to moderate pain, and symptoms often come and go over time. What are the causes? This condition results from increased friction between the hip bones and the tendons around the hip joint. This condition can happen if you: Overuse your hip muscles. Injure your hip. Have weak buttocks muscles. Have bone spurs. Have an infection. In some cases, the cause may not be known. What increases the risk? You are more likely to develop this condition if: You injured your hip previously or had hip surgery. You have a medical condition, such as arthritis, gout, diabetes, or thyroid disease. You have spine problems. You have one leg that is shorter than the other. You participate in athletic activities that include repetitive motion, like running. You participate in sports where there is a risk of injury or falling, such as football, martial arts, or skiing. What are the signs or symptoms? Symptoms may come and go, and they often include: Pain in the hip or groin area. Pain may get worse with movement. Tenderness and swelling of the hip. In rare cases, the bursa may become infected. If this happens, you may get a fever, as well as warmth and redness in the hip area. How is this diagnosed? This condition may be diagnosed based on: Your symptoms. Your medical history. A physical exam. Imaging tests, such as: X-rays to check your bones. MRI or ultrasound to check your tendons and muscles. Bone scan. How is this treated? This condition is treated by resting, icing, applying pressure (compression), and raising (elevating) the injured area. This is called RICE treatment. In some cases, RICE treatment may not be enough to make your symptoms go away. Treatment may also include: Using  crutches, a cane, or a walker to decrease the strain on your hip. Taking medicine to help with swelling and pain. Getting a shot of  cortisone medicine near the affected area to reduce swelling and pain. Taking antibiotic medicines if there is an infection. Draining fluid out of the bursa to help relieve swelling and pain. Having surgery to remove a damaged or infected bursa. This is rare. Long-term treatment may include: Physical therapy exercises for strength and flexibility. Identifying the cause of your bursitis to prevent future episodes. Lifestyle changes, such as weight loss, to reduce the strain on the hip. Follow these instructions at home: Managing pain, stiffness, and swelling     If directed, put ice on the affected area. To do this: Put ice in a plastic bag. Place a towel between your skin and the bag. Leave the ice on for 20 minutes, 2-3 times a day. Remove the ice if your skin turns bright red. This is very important. If you cannot feel pain, heat, or cold, you have a greater risk of damage to the area. Elevate your hip as much as you can without feeling pain. To do this, put a pillow under your hips while you lie down. If directed, apply heat to the affected area as often as told by your health care provider. Use the heat source that your health care provider recommends, such as a moist heat pack or a heating pad. Place a towel between your skin and the heat source. Leave the heat on for 20-30 minutes. Remove the heat if your skin turns bright red. This is especially important if you are unable to feel pain, heat, or cold. You may have a greater risk of getting burned. Activity Do not use your hip to support your body weight until your health care provider says that you can. Use crutches, a cane, or a walker as told by your health care provider. If the affected leg is one that you use to drive, ask your health care provider if it is safe to drive. Rest and protect your hip  as much as possible until your pain and swelling get better. Return to your normal activities as told by your health care provider. Ask your health care provider what activities are safe for you. Do exercises as told by your health care provider. General instructions Take over-the-counter and prescription medicines only as told by your health care provider. Gently massage and stretch your injured area as often as is comfortable. Wear compression wraps only as told by your health care provider. If one of your legs is shorter than the other, get fitted for a shoe insert or orthotic. Your health care provider or physical therapist can tell you where to find these items and what size you need. Maintain a healthy weight. Follow instructions from your health care provider for weight control. These may include dietary restrictions. Keep all follow-up visits. This is important. How is this prevented? Exercise regularly or as told by your health care provider. Wear supportive footwear that is appropriate for your sport and daily activities. Warm up and stretch before being active. Cool down and stretch after being active. Take breaks regularly from repetitive activity. If an activity irritates your hip or causes pain, avoid the activity as much as possible. Avoid sitting down for long periods at a time. Where to find more information [employer]: orthoinfo.aaos.org Contact a health care provider if: You have a fever. You develop new symptoms. You have trouble walking or doing everyday activities. You have pain that gets worse or does not get better with medicine. You develop red skin or a feeling  of warmth in your hip area. Get help right away if: You cannot move your hip. You have severe pain. You cannot control the muscles in your feet. Summary Hip bursitis is the swelling of one or more of the fluid-filled sacs (bursae) in the hip joint. Hip bursitis can cause  hip or groin pain, and symptoms often come and go over time. This condition is often treated by resting, icing, applying pressure (compression), and raising (elevating) the injured area. Other treatments may be needed. This information is not intended to replace advice given to you by your health care provider. Make sure you discuss any questions you have with your health care provider. Document Revised: 12/08/2021 Document Reviewed: 12/08/2021 Elsevier Patient Education  2024 [employer].

## 2023-05-20 NOTE — Progress Notes (Signed)
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 pillow under your hips while you lie down. If directed, apply heat to the affected area as often as told by your health care provider. Use the heat source that your health care provider recommends, such as a moist heat pack or a heating pad. Place a towel between your skin and the heat source. Leave the heat on for 20-30 minutes. Remove the heat if your skin turns bright red. This is especially important if you are unable to feel pain, heat, or cold. You may have a greater risk of getting burned. Activity Do not use your hip to support your body weight until your health care provider says that you can. Use crutches, a cane, or a walker as told by your health care provider. If the affected leg is one that you use to drive, ask your health care provider if it is safe to drive. Rest and protect your hip as much as possible until your pain and swelling get better. Return to your normal activities as told by  your health care provider. Ask your health care provider what activities are safe for you. Do exercises as told by your health care provider. General instructions Take over-the-counter and prescription medicines only as told by your health care provider. Gently massage and stretch your injured area as often as is comfortable. Wear compression wraps only as told by your health care provider. If one of your legs is shorter than the other, get fitted for a shoe insert or orthotic. Your health care provider or physical therapist can tell you where to find these items and what size you need. Maintain a healthy weight. Follow instructions from your health care provider for weight control. These may include dietary restrictions. Keep all follow-up visits. This is important. How is this prevented? Exercise regularly or as told by your health care provider. Wear supportive footwear that is appropriate for your sport and daily activities. Warm up and stretch before being active. Cool down and stretch after being active. Take breaks regularly from repetitive activity. If an activity irritates your hip or causes pain, avoid the activity as much as possible. Avoid sitting down for long periods at a time. Where to find more information [employer]: orthoinfo.aaos.org Contact a health care provider if: You have a fever. You develop new symptoms. You have trouble walking or doing everyday activities. You have pain that gets worse or does not get better with medicine. You develop red skin or a feeling of warmth in your hip area. Get help right away if: You cannot move  your hip. You have severe pain. You cannot control the muscles in your feet. Summary Hip bursitis is the swelling of one or more of the fluid-filled sacs (bursae) in the hip joint. Hip bursitis can cause hip or groin pain, and symptoms often come and go over time. This condition is often treated by resting,  icing, applying pressure (compression), and raising (elevating) the injured area. Other treatments may be needed. This information is not intended to replace advice given to you by your health care provider. Make sure you discuss any questions you have with your health care provider. Document Revised: 12/08/2021 Document Reviewed: 12/08/2021 Elsevier Patient Education  2024 Elsevier Inc.        Signed,   Meredith Staggers, MD [facility], [facility] 05/20/23 9:09 AM

## 2023-05-31 ENCOUNTER — Other Ambulatory Visit: Payer: Self-pay | Admitting: Family Medicine

## 2023-05-31 DIAGNOSIS — F4323 Adjustment disorder with mixed anxiety and depressed mood: Secondary | ICD-10-CM

## 2023-05-31 DIAGNOSIS — F418 Other specified anxiety disorders: Secondary | ICD-10-CM

## 2023-06-26 ENCOUNTER — Other Ambulatory Visit: Payer: Self-pay | Admitting: Family Medicine

## 2023-06-26 DIAGNOSIS — M7061 Trochanteric bursitis, right hip: Secondary | ICD-10-CM

## 2023-06-26 DIAGNOSIS — G8929 Other chronic pain: Secondary | ICD-10-CM

## 2023-06-26 DIAGNOSIS — M533 Sacrococcygeal disorders, not elsewhere classified: Secondary | ICD-10-CM

## 2023-06-27 NOTE — Telephone Encounter (Signed)
Patient is requesting a refill of the following medications: Requested Prescriptions   Pending Prescriptions Disp Refills   meloxicam (MOBIC) 7.5 MG tablet [Pharmacy Med Name: MELOXICAM 7.5 MG TABLET] 30 tablet 0    Sig: TAKE 1 TABLET BY MOUTH EVERY DAY    Date of patient request: 06/26/23 Last office visit: 05/20/23 Date of last refill: 05/20/23 Last refill amount: 30

## 2023-07-08 ENCOUNTER — Ambulatory Visit: Payer: BC Managed Care – PPO | Admitting: Family Medicine

## 2023-10-24 ENCOUNTER — Emergency Department (HOSPITAL_COMMUNITY)
Admission: EM | Admit: 2023-10-24 | Discharge: 2023-10-24 | Disposition: A | Discharge status: Home / Self Care | Payer: BC Managed Care – PPO | Attending: Emergency Medicine | Admitting: Emergency Medicine

## 2023-10-24 ENCOUNTER — Other Ambulatory Visit: Payer: Self-pay

## 2023-10-24 ENCOUNTER — Encounter (HOSPITAL_COMMUNITY): Payer: Self-pay

## 2023-10-24 DIAGNOSIS — Z79890 Hormone replacement therapy (postmenopausal): Secondary | ICD-10-CM | POA: Insufficient documentation

## 2023-10-24 DIAGNOSIS — E039 Hypothyroidism, unspecified: Secondary | ICD-10-CM | POA: Diagnosis not present

## 2023-10-24 DIAGNOSIS — R002 Palpitations: Secondary | ICD-10-CM | POA: Diagnosis present

## 2023-10-24 LAB — BASIC METABOLIC PANEL
Anion gap: 9 (ref 5–15)
BUN: 14 mg/dL (ref 6–20)
CO2: 24 mmol/L (ref 22–32)
Calcium: 8.8 mg/dL — ABNORMAL LOW (ref 8.9–10.3)
Chloride: 102 mmol/L (ref 98–111)
Creatinine, Ser: 0.89 mg/dL (ref 0.44–1.00)
GFR, Estimated: >60 mL/min (ref >60)
Glucose, Bld: 129 mg/dL — ABNORMAL HIGH (ref 70–99)
Potassium: 3 mmol/L — ABNORMAL LOW (ref 3.5–5.1)
Sodium: 135 mmol/L (ref 135–145)

## 2023-10-24 LAB — CBC
HCT: 36.4 % (ref 36.0–46.0)
Hemoglobin: 11.9 g/dL — ABNORMAL LOW (ref 12.0–15.0)
MCH: 30.1 pg (ref 26.0–34.0)
MCHC: 32.7 g/dL (ref 30.0–36.0)
MCV: 92.2 fL (ref 80.0–100.0)
Platelets: 176 10*3/uL (ref 150–400)
RBC: 3.95 MIL/uL (ref 3.87–5.11)
RDW: 15.7 % — ABNORMAL HIGH (ref 11.5–15.5)
WBC: 7.3 10*3/uL (ref 4.0–10.5)
nRBC: 0 % (ref 0.0–0.2)

## 2023-10-24 LAB — TROPONIN I (HIGH SENSITIVITY): Troponin I (High Sensitivity): 3 ng/L (ref <18)

## 2023-10-24 NOTE — ED Triage Notes (Signed)
Pt presents via POV c/o heart palpitations since this am. Denies chest pain. Reports palpitations have been steady since this am, denies currently.

## 2023-10-24 NOTE — ED Provider Notes (Signed)
[place] EMERGENCY DEPARTMENT AT [facility] Provider Note   CSN: [id number] Arrival date & time: 10/24/23  0112     History  Chief Complaint  Patient presents with   Palpitations    Megan Bullock is a 57 y.o. female.  Presents to the emerged department for evaluation of heart palpitations.  Patient reports that she has been having palpitations throughout the day.  Reports that she has had episodes of this in the past, did see cardiologist for it but it was "years ago".  No associated chest pain or shortness of breath.       Home Medications Prior to Admission medications   Medication Sig Start Date End Date Taking? Authorizing Provider  albuterol (VENTOLIN HFA) 108 (90 Base) MCG/ACT inhaler Inhale 1-2 puffs into the lungs every 6 (six) hours as needed for wheezing or shortness of breath. 05/21/19   Collie Siad A, MD  amLODipine (NORVASC) 5 MG tablet Take 1 tablet (5 mg total) by mouth daily. 05/04/23   Shade Flood, MD  atorvastatin (LIPITOR) 20 MG tablet Take 1 tablet (20 mg total) by mouth daily. 05/04/23   Shade Flood, MD  cetirizine (ZYRTEC) 10 MG tablet Take 1 tablet (10 mg total) by mouth daily. 05/21/20   Shade Flood, MD  fluticasone (FLONASE) 50 MCG/ACT nasal spray Place 2 sprays into both nostrils daily. 05/04/23   Shade Flood, MD  hydrOXYzine (ATARAX) 25 MG tablet Take 0.5-1 tablets (12.5-25 mg total) by mouth 3 (three) times daily as needed for anxiety (or sleep.). 05/04/23   Shade Flood, MD  levothyroxine (SYNTHROID) 125 MCG tablet Take 1 tablet (125 mcg total) by mouth daily. 05/04/23   Shade Flood, MD  meloxicam (MOBIC) 7.5 MG tablet TAKE 1 TABLET BY MOUTH EVERY DAY 06/27/23   Alveria Apley, NP  metoprolol succinate (TOPROL-XL) 100 MG 24 hr tablet Take 1 tablet (100 mg total) by mouth daily. Take with or immediately following a meal. 05/04/23   Shade Flood, MD  sertraline (ZOLOFT) 50 MG tablet TAKE 1 TABLET BY MOUTH  EVERY DAY 05/31/23   Shade Flood, MD      Allergies    Bactrim [sulfamethoxazole-trimethoprim], Bupropion, and Varenicline    Review of Systems   Review of Systems  Physical Exam Updated Vital Signs BP 132/87 (BP Location: Right Arm)   Pulse 80   Temp 97.9 F (36.6 C) (Oral)   Resp 20   LMP 03/27/2020 (Approximate)   SpO2 98%  Physical Exam Vitals and nursing note reviewed.  Constitutional:      General: She is not in acute distress.    Appearance: She is well-developed.  HENT:     Head: Normocephalic and atraumatic.     Mouth/Throat:     Mouth: Mucous membranes are moist.  Eyes:     General: Vision grossly intact. Gaze aligned appropriately.     Extraocular Movements: Extraocular movements intact.     Conjunctiva/sclera: Conjunctivae normal.  Cardiovascular:     Rate and Rhythm: Normal rate and regular rhythm.     Pulses: Normal pulses.     Heart sounds: Normal heart sounds, S1 normal and S2 normal. No murmur heard.    No friction rub. No gallop.  Pulmonary:     Effort: Pulmonary effort is normal. No respiratory distress.     Breath sounds: Normal breath sounds.  Abdominal:     General: Bowel sounds are normal.     Palpations:  Abdomen is soft.     Tenderness: There is no abdominal tenderness. There is no guarding or rebound.     Hernia: No hernia is present.  Musculoskeletal:        General: No swelling.     Cervical back: Full passive range of motion without pain, normal range of motion and neck supple. No spinous process tenderness or muscular tenderness. Normal range of motion.     Right lower leg: No edema.     Left lower leg: No edema.  Skin:    General: Skin is warm and dry.     Capillary Refill: Capillary refill takes less than 2 seconds.     Findings: No ecchymosis, erythema, rash or wound.  Neurological:     General: No focal deficit present.     Mental Status: She is alert and oriented to person, place, and time.     GCS: GCS eye subscore is 4.  GCS verbal subscore is 5. GCS motor subscore is 6.     Cranial Nerves: Cranial nerves 2-12 are intact.     Sensory: Sensation is intact.     Motor: Motor function is intact.     Coordination: Coordination is intact.  Psychiatric:        Attention and Perception: Attention normal.        Mood and Affect: Mood normal.        Speech: Speech normal.        Behavior: Behavior normal.     ED Results / Procedures / Treatments   Labs (all labs ordered are listed, but only abnormal results are displayed) Labs Reviewed  BASIC METABOLIC PANEL - Abnormal; Notable for the following components:      Result Value   Potassium 3.0 (*)    Glucose, Bld 129 (*)    Calcium 8.8 (*)    All other components within normal limits  CBC - Abnormal; Notable for the following components:   Hemoglobin 11.9 (*)    RDW 15.7 (*)    All other components within normal limits  TROPONIN I (HIGH SENSITIVITY)  TROPONIN I (HIGH SENSITIVITY)    EKG EKG Interpretation Date/Time:  Monday October 24 2023 01:27:12 EDT Ventricular Rate:  80 PR Interval:  160 QRS Duration:  87 QT Interval:  360 QTC Calculation: 416 R Axis:   3  Text Interpretation: Sinus rhythm Abnormal R-wave progression, early transition Nonspecific T abnormalities, diffuse leads Confirmed by Gilda Crease [phone]) on 10/24/2023 5:18:25 AM  Radiology No results found.  Procedures Procedures    Medications Ordered in ED Medications - No data to display  ED Course/ Medical Decision Making/ A&P                                 Medical Decision Making Amount and/or Complexity of Data Reviewed Labs: ordered.   Presents to the emergency department for evaluation of palpitations.  This has been a problem for the patient in the past but has been sometime.  At time of my evaluation, the palpitations have resolved.  She does, however, reports that symptoms lasted through most of the day.  Basic lab work is unremarkable.  EKG  unremarkable.  She does have a history of hypothyroidism, TSH was not drawn when the lab work was drawn.  She does not have any vital signs or symptomatology that would suggest hyperthyroid at this time.  At this point I suspect she  was having PACs or PVCs but I do not have data to rule out more significant arrhythmia.  Will refer to cardiology for further outpatient workup.        Final Clinical Impression(s) / ED Diagnoses Final diagnoses:  Palpitations    Rx / DC Orders ED Discharge Orders          Ordered    Ambulatory referral to Cardiology       Comments: If you have not heard from the Cardiology office within the next 72 hours please call [phone].   10/24/23 [id number]              Gilda Crease, MD 10/24/23 [phone]

## 2023-11-29 ENCOUNTER — Other Ambulatory Visit: Payer: Self-pay | Admitting: Family Medicine

## 2023-11-29 DIAGNOSIS — E039 Hypothyroidism, unspecified: Secondary | ICD-10-CM

## 2023-12-22 ENCOUNTER — Other Ambulatory Visit: Payer: Self-pay | Admitting: Family Medicine

## 2023-12-22 DIAGNOSIS — F418 Other specified anxiety disorders: Secondary | ICD-10-CM

## 2023-12-22 DIAGNOSIS — F4323 Adjustment disorder with mixed anxiety and depressed mood: Secondary | ICD-10-CM

## 2023-12-22 DIAGNOSIS — I10 Essential (primary) hypertension: Secondary | ICD-10-CM

## 2024-01-28 ENCOUNTER — Other Ambulatory Visit: Payer: Self-pay | Admitting: Family Medicine

## 2024-01-28 DIAGNOSIS — M533 Sacrococcygeal disorders, not elsewhere classified: Secondary | ICD-10-CM

## 2024-01-28 DIAGNOSIS — M7061 Trochanteric bursitis, right hip: Secondary | ICD-10-CM

## 2024-01-28 DIAGNOSIS — G8929 Other chronic pain: Secondary | ICD-10-CM

## 2024-03-09 ENCOUNTER — Ambulatory Visit (HOSPITAL_COMMUNITY)
Admission: RE | Admit: 2024-03-09 | Discharge: 2024-03-09 | Disposition: A | Discharge status: Home / Self Care | Source: Ambulatory Visit | Attending: Family Medicine | Admitting: Family Medicine

## 2024-03-09 ENCOUNTER — Encounter (HOSPITAL_COMMUNITY): Payer: Self-pay

## 2024-03-09 ENCOUNTER — Ambulatory Visit (HOSPITAL_COMMUNITY)

## 2024-03-09 VITALS — BP 129/86 | HR 66 | Temp 98.9°F | Resp 16

## 2024-03-09 DIAGNOSIS — M25522 Pain in left elbow: Secondary | ICD-10-CM

## 2024-03-09 MED ORDER — PREDNISONE 20 MG PO TABS: 40.0000 mg | ORAL_TABLET | Freq: Every day | ORAL | 0 refills | Status: AC

## 2024-03-09 NOTE — Discharge Instructions (Signed)
 By my review your x-rays do not show any acute bony abnormality like a fracture, there are some changes that might be consistent with arthritis.  The radiologist will also read your x-ray, and if their interpretation differs significantly from mine, and the management of your condition would change, we will call you.  Take prednisone 20 mg--2 daily for 5 days This is for inflammation.  You can also take Tylenol with the prednisone as needed.  Please follow-up with your primary care about this issue; you may need further testing to define what is going on with your elbow and numbness
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
## 2024-03-09 NOTE — ED Triage Notes (Signed)
 Patient reports that she had a fall on 01/03/24 and hit her left elbow.  Patient reports that she has been having left elbow pain radiating into her left shoulder, numbness and tingling. Patient states the "tingling was different yesterday"  Patient reports that she has not taken any medications for her symptoms.Megan Bullock

## 2024-03-30 ENCOUNTER — Other Ambulatory Visit: Payer: Self-pay | Admitting: Family Medicine

## 2024-03-30 DIAGNOSIS — E039 Hypothyroidism, unspecified: Secondary | ICD-10-CM

## 2024-03-31 LAB — HM MAMMOGRAPHY

## 2024-04-03 ENCOUNTER — Encounter: Payer: Self-pay | Admitting: Family Medicine

## 2024-06-20 ENCOUNTER — Ambulatory Visit (INDEPENDENT_AMBULATORY_CARE_PROVIDER_SITE_OTHER): Admitting: Family Medicine

## 2024-06-20 ENCOUNTER — Other Ambulatory Visit (INDEPENDENT_AMBULATORY_CARE_PROVIDER_SITE_OTHER)

## 2024-06-20 ENCOUNTER — Ambulatory Visit: Payer: Self-pay | Admitting: Family Medicine

## 2024-06-20 ENCOUNTER — Other Ambulatory Visit: Payer: Self-pay | Admitting: Family Medicine

## 2024-06-20 ENCOUNTER — Encounter: Payer: Self-pay | Admitting: Family Medicine

## 2024-06-20 VITALS — BP 124/78 | HR 88 | Temp 98.3°F | Resp 16 | Ht 66.0 in | Wt 210.6 lb

## 2024-06-20 DIAGNOSIS — R7303 Prediabetes: Secondary | ICD-10-CM | POA: Diagnosis not present

## 2024-06-20 DIAGNOSIS — J3089 Other allergic rhinitis: Secondary | ICD-10-CM

## 2024-06-20 DIAGNOSIS — F418 Other specified anxiety disorders: Secondary | ICD-10-CM

## 2024-06-20 DIAGNOSIS — E785 Hyperlipidemia, unspecified: Secondary | ICD-10-CM

## 2024-06-20 DIAGNOSIS — E039 Hypothyroidism, unspecified: Secondary | ICD-10-CM

## 2024-06-20 DIAGNOSIS — G4700 Insomnia, unspecified: Secondary | ICD-10-CM

## 2024-06-20 DIAGNOSIS — F4323 Adjustment disorder with mixed anxiety and depressed mood: Secondary | ICD-10-CM

## 2024-06-20 DIAGNOSIS — E876 Hypokalemia: Secondary | ICD-10-CM

## 2024-06-20 DIAGNOSIS — I10 Essential (primary) hypertension: Secondary | ICD-10-CM | POA: Diagnosis not present

## 2024-06-20 DIAGNOSIS — J4520 Mild intermittent asthma, uncomplicated: Secondary | ICD-10-CM

## 2024-06-20 LAB — LIPID PANEL
Cholesterol: 443 mg/dL — ABNORMAL HIGH (ref 0–200)
HDL: 43.1 mg/dL (ref >39.00)
LDL Cholesterol: 328 mg/dL — ABNORMAL HIGH (ref 0–99)
NonHDL: 399.95
Total CHOL/HDL Ratio: 10
Triglycerides: 362 mg/dL — ABNORMAL HIGH (ref 0.0–149.0)
VLDL: 72.4 mg/dL — ABNORMAL HIGH (ref 0.0–40.0)

## 2024-06-20 LAB — COMPREHENSIVE METABOLIC PANEL WITH GFR
ALT: 19 U/L (ref 0–35)
AST: 25 U/L (ref 0–37)
Albumin: 4.4 g/dL (ref 3.5–5.2)
Alkaline Phosphatase: 83 U/L (ref 39–117)
BUN: 9 mg/dL (ref 6–23)
CO2: 24 meq/L (ref 19–32)
Calcium: 9.6 mg/dL (ref 8.4–10.5)
Chloride: 103 meq/L (ref 96–112)
Creatinine, Ser: 1.38 mg/dL — ABNORMAL HIGH (ref 0.40–1.20)
GFR: 42.19 mL/min — ABNORMAL LOW (ref >60.00)
Glucose, Bld: 105 mg/dL — ABNORMAL HIGH (ref 70–99)
Potassium: 3.3 meq/L — ABNORMAL LOW (ref 3.5–5.1)
Sodium: 139 meq/L (ref 135–145)
Total Bilirubin: 0.5 mg/dL (ref 0.2–1.2)
Total Protein: 7.2 g/dL (ref 6.0–8.3)

## 2024-06-20 LAB — HEMOGLOBIN A1C: Hgb A1c MFr Bld: 6.4 % (ref 4.6–6.5)

## 2024-06-20 LAB — TSH: TSH: 211.53 u[IU]/mL — ABNORMAL HIGH (ref 0.35–5.50)

## 2024-06-20 MED ORDER — METOPROLOL SUCCINATE ER 100 MG PO TB24
100.0000 mg | ORAL_TABLET | Freq: Every day | ORAL | 1 refills | Status: AC
Start: 2024-06-20 — End: ?

## 2024-06-20 MED ORDER — SERTRALINE HCL 100 MG PO TABS
100.0000 mg | ORAL_TABLET | Freq: Every day | ORAL | 3 refills | Status: DC
Start: 2024-06-20 — End: 2024-07-16

## 2024-06-20 MED ORDER — AMLODIPINE BESYLATE 5 MG PO TABS
5.0000 mg | ORAL_TABLET | Freq: Every day | ORAL | 1 refills | Status: AC
Start: 2024-06-20 — End: ?

## 2024-06-20 MED ORDER — CETIRIZINE HCL 10 MG PO TABS
10.0000 mg | ORAL_TABLET | Freq: Every day | ORAL | 3 refills | Status: AC
Start: 2024-06-20 — End: ?

## 2024-06-20 MED ORDER — AIRSUPRA 90-80 MCG/ACT IN AERO
1.0000 | INHALATION_SPRAY | Freq: Four times a day (QID) | RESPIRATORY_TRACT | 1 refills | Status: AC | PRN
Start: 2024-06-20 — End: ?

## 2024-06-20 MED ORDER — FLUTICASONE PROPIONATE 50 MCG/ACT NA SUSP
2.0000 | Freq: Every day | NASAL | 6 refills | Status: AC
Start: 2024-06-20 — End: ?

## 2024-06-20 MED ORDER — ATORVASTATIN CALCIUM 20 MG PO TABS
20.0000 mg | ORAL_TABLET | Freq: Every day | ORAL | 1 refills | Status: AC
Start: 2024-06-20 — End: ?

## 2024-06-20 MED ORDER — LEVOTHYROXINE SODIUM 125 MCG PO TABS: 125.0000 ug | ORAL_TABLET | Freq: Every day | ORAL | 0 refills | Status: DC

## 2024-06-20 MED ORDER — HYDROXYZINE HCL 10 MG PO TABS
10.0000 mg | ORAL_TABLET | Freq: Every evening | ORAL | 0 refills | Status: DC | PRN
Start: 2024-06-20 — End: 2024-07-16

## 2024-06-20 MED ORDER — POTASSIUM CHLORIDE CRYS ER 10 MEQ PO TBCR: 10.0000 meq | EXTENDED_RELEASE_TABLET | Freq: Every day | ORAL | 0 refills | Status: DC
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
## 2024-06-20 NOTE — Patient Instructions (Addendum)
 Restart Flonase  daily as well as Zyrtec  once per day for allergy treatment which may help with some of the postnasal drip and cough.  Inhaler refilled but new inhaler has a little bit of a steroid in it which may help asthma symptoms better than the plain albuterol .  I will give you a coupon.  Can be taken up to every 6 hours as needed.  If any worsening asthma symptoms in the next few days be seen, either here or urgent care.  Sorry to hear things are not going well with anxiety and depression. We will work to get this better. Keep follow up with therapist on Monday. Restart sertraline  1/2 pill for 1 week then full pill - higher dose. Lower dose hydroxyzine  may help with sleep. I sent that in as well as your other meds for now - will discuss those further next visit. Please have labs today at [facility] location below.   988 hotline if any return of suicidal thoughts.  [facility] at the [facility]. [address] [place], [place] [address] [phone].   [facility] or xray: Walk in 8:30-4:30 during weekdays, no appointment needed [address]  [place], [place] [address]   Managing Depression, Adult Depression is a mental health condition that affects your thoughts, feelings, and actions. Being diagnosed with depression can bring you relief if you did not know why you have felt or behaved a certain way. It could also leave you feeling overwhelmed. Finding ways to manage your symptoms can help you feel more positive about your future. How to manage lifestyle changes Being depressed is difficult. Depression can increase the level of everyday stress. Stress can make depression symptoms worse. You may believe your symptoms cannot be managed or will never improve. However, there are many things you can try to help manage your symptoms. There is hope. Managing stress  Stress is your body's reaction to life changes and events, both good and bad.  Stress can add to your feelings of depression. Learning to manage your stress can help lessen your feelings of depression. Try some of the following approaches to reducing your stress (stress reduction techniques): Listen to music that you enjoy and that inspires you. Try using a meditation app or take a meditation class. Develop a practice that helps you connect with your spiritual self. Walk in nature, pray, or go to a place of worship. Practice deep breathing. To do this, inhale slowly through your nose. Pause at the top of your inhale for a few seconds and then exhale slowly, letting yourself relax. Repeat this three or four times. Practice yoga to help relax and work your muscles. Choose a stress reduction technique that works for you. These techniques take time and practice to develop. Set aside 5-15 minutes a day to do them. Therapists can offer training in these techniques. Do these things to help manage stress: Keep a journal. Know your limits. Set healthy boundaries for yourself and others, such as saying no when you think something is too much. Pay attention to how you react to certain situations. You may not be able to control everything, but you can change your reaction. Add humor to your life by watching funny movies or shows. Make time for activities that you enjoy and that relax you. Spend less time using electronics, especially at night before bed. The light from screens can make your brain think it is time to get up rather than go  to bed.  Medicines Medicines, such as antidepressants, are often a part of treatment for depression. Talk with your pharmacist or health care provider about all the medicines, supplements, and herbal products that you take, their possible side effects, and what medicines and other products are safe to take together. Make sure to report any side effects you may have to your health care provider. Relationships Your health care provider may suggest  family therapy, couples therapy, or individual therapy as part of your treatment. How to recognize changes Everyone responds differently to treatment for depression. As you recover from depression, you may start to: Have more interest in doing activities. Feel more hopeful. Have more energy. Eat a more regular amount of food. Have better mental focus. It is important to recognize if your depression is not getting better or is getting worse. The symptoms you had in the beginning may return, such as: Feeling tired. Eating too much or too little. Sleeping too much or too little. Feeling restless, agitated, or hopeless. Trouble focusing or making decisions. Having unexplained aches and pains. Feeling irritable, angry, or aggressive. If you or your family members notice these symptoms coming back, let your health care provider know right away. Follow these instructions at home: Activity Try to get some form of exercise each day, such as walking. Try yoga, mindfulness, or other stress reduction techniques. Participate in group activities if you are able. Lifestyle Get enough sleep. Cut down on or stop using caffeine, tobacco, alcohol, and any other harmful substances. Eat a healthy diet that includes plenty of vegetables, fruits, whole grains, low-fat dairy products, and lean protein. Limit foods that are high in solid fats, added sugar, or salt (sodium). General instructions Take over-the-counter and prescription medicines only as told by your health care provider. Keep all follow-up visits. It is important for your health care provider to check on your mood, behavior, and medicines. Your health care provider may need to make changes to your treatment. Where to find support Talking to others  Friends and family members can be sources of support and guidance. Talk to trusted friends or family members about your condition. Explain your symptoms and let them know that you are working with a  health care provider to treat your depression. Tell friends and family how they can help. Finances Find mental health providers that fit with your financial situation. Talk with your health care provider if you are worried about access to food, housing, or medicine. Call your insurance company to learn about your co-pays and prescription plan. Where to find more information You can find support in your area from: Anxiety and Depression Association of America (ADAA): adaa.org Mental Health America: mentalhealthamerica.net The [employer] on Mental Illness: nami.org Contact a health care provider if: You stop taking your antidepressant medicines, and you have any of these symptoms: Nausea. Headache. Light-headedness. Chills and body aches. Not being able to sleep (insomnia). You or your friends and family think your depression is getting worse. Get help right away if: You have thoughts of hurting yourself or others. Get help right away if you feel like you may hurt yourself or others, or have thoughts about taking your own life. Go to your nearest emergency room or: Call 911. Call the National Suicide Prevention Lifeline at 862-669-7532 or 988. This is open 24 hours a day. Text the Crisis Text Line at 561-743-5440. This information is not intended to replace advice given to you by your health care provider. Make sure you discuss any  questions you have with your health care provider. Document Revised: 04/20/2022 Document Reviewed: 04/20/2022 Elsevier Patient Education  2024 [employer].

## 2024-06-20 NOTE — Progress Notes (Signed)
 See labs

## 2024-07-05 ENCOUNTER — Ambulatory Visit (INDEPENDENT_AMBULATORY_CARE_PROVIDER_SITE_OTHER): Admitting: Family Medicine

## 2024-07-05 ENCOUNTER — Ambulatory Visit: Admitting: Family Medicine

## 2024-07-05 VITALS — BP 118/60 | HR 67 | Temp 98.3°F | Resp 16 | Ht 66.0 in | Wt 210.8 lb

## 2024-07-05 DIAGNOSIS — F418 Other specified anxiety disorders: Secondary | ICD-10-CM

## 2024-07-05 DIAGNOSIS — F4323 Adjustment disorder with mixed anxiety and depressed mood: Secondary | ICD-10-CM

## 2024-07-05 DIAGNOSIS — J4520 Mild intermittent asthma, uncomplicated: Secondary | ICD-10-CM

## 2024-07-05 DIAGNOSIS — E876 Hypokalemia: Secondary | ICD-10-CM | POA: Diagnosis not present

## 2024-07-05 DIAGNOSIS — R7989 Other specified abnormal findings of blood chemistry: Secondary | ICD-10-CM

## 2024-07-05 DIAGNOSIS — E039 Hypothyroidism, unspecified: Secondary | ICD-10-CM

## 2024-07-05 DIAGNOSIS — E785 Hyperlipidemia, unspecified: Secondary | ICD-10-CM

## 2024-07-05 DIAGNOSIS — I10 Essential (primary) hypertension: Secondary | ICD-10-CM | POA: Diagnosis not present

## 2024-07-05 MED ORDER — BUDESONIDE-FORMOTEROL FUMARATE 80-4.5 MCG/ACT IN AERO: 2.0000 | INHALATION_SPRAY | Freq: Two times a day (BID) | RESPIRATORY_TRACT | 3 refills | Status: AC

## 2024-07-05 NOTE — Patient Instructions (Addendum)
 New inhaler sent for asthma for maintenance treatment.  If too expensive let me know. Can still use Airsupra  if needed. Will recheck in next few weeks.  Repeat labs today - continue same meds for now and continue to drink plenty of water.  It sounds like things may be starting to improve regarding the depression symptoms.  I think the sertraline  will continue to be more effective, can take 4 to 6 weeks to see the effect from that medication.  Keep follow-up with your therapist as planned.  If you are noticing more suicidal thoughts, or any worsening symptoms, call 988 hotline or seek care immediately as we discussed.  Resources below.  Hang in there.  I do think things will continue to get better.  I will see you in 2 weeks and we can recheck other labs at that time but no other med changes for now.  988 hotline if any return of suicidal thoughts.  [facility] at the [facility]. [address] [place], [place] [address] [phone].  Return to the clinic or go to the nearest emergency room if any of your symptoms worsen or new symptoms occur.
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
## 2024-07-06 LAB — BASIC METABOLIC PANEL WITH GFR
BUN: 10 mg/dL (ref 6–23)
CO2: 28 meq/L (ref 19–32)
Calcium: 9.5 mg/dL (ref 8.4–10.5)
Chloride: 103 meq/L (ref 96–112)
Creatinine, Ser: 1.19 mg/dL (ref 0.40–1.20)
GFR: 50.38 mL/min — ABNORMAL LOW (ref >60.00)
Glucose, Bld: 84 mg/dL (ref 70–99)
Potassium: 3.7 meq/L (ref 3.5–5.1)
Sodium: 139 meq/L (ref 135–145)

## 2024-07-08 ENCOUNTER — Ambulatory Visit: Payer: Self-pay | Admitting: Family Medicine

## 2024-07-16 ENCOUNTER — Other Ambulatory Visit: Payer: Self-pay | Admitting: Family Medicine

## 2024-07-16 DIAGNOSIS — F4323 Adjustment disorder with mixed anxiety and depressed mood: Secondary | ICD-10-CM

## 2024-07-16 DIAGNOSIS — G4700 Insomnia, unspecified: Secondary | ICD-10-CM

## 2024-07-16 DIAGNOSIS — E876 Hypokalemia: Secondary | ICD-10-CM

## 2024-07-16 DIAGNOSIS — F418 Other specified anxiety disorders: Secondary | ICD-10-CM

## 2024-07-18 ENCOUNTER — Ambulatory Visit: Admitting: Family Medicine

## 2024-10-15 ENCOUNTER — Other Ambulatory Visit: Payer: Self-pay | Admitting: Family Medicine

## 2024-10-15 DIAGNOSIS — E039 Hypothyroidism, unspecified: Secondary | ICD-10-CM

## 2024-12-17 ENCOUNTER — Emergency Department (HOSPITAL_BASED_OUTPATIENT_CLINIC_OR_DEPARTMENT_OTHER)
Admission: EM | Admit: 2024-12-17 | Discharge: 2024-12-17 | Disposition: A | Discharge status: Home / Self Care | Payer: Self-pay | Attending: Emergency Medicine | Admitting: Emergency Medicine

## 2024-12-17 ENCOUNTER — Encounter (HOSPITAL_BASED_OUTPATIENT_CLINIC_OR_DEPARTMENT_OTHER): Payer: Self-pay

## 2024-12-17 ENCOUNTER — Other Ambulatory Visit: Payer: Self-pay

## 2024-12-17 DIAGNOSIS — Z23 Encounter for immunization: Secondary | ICD-10-CM | POA: Insufficient documentation

## 2024-12-17 DIAGNOSIS — X153XXA Contact with hot saucepan or skillet, initial encounter: Secondary | ICD-10-CM | POA: Insufficient documentation

## 2024-12-17 DIAGNOSIS — T23101A Burn of first degree of right hand, unspecified site, initial encounter: Secondary | ICD-10-CM | POA: Insufficient documentation

## 2024-12-17 DIAGNOSIS — T23131A Burn of first degree of multiple right fingers (nail), not including thumb, initial encounter: Secondary | ICD-10-CM | POA: Insufficient documentation

## 2024-12-17 DIAGNOSIS — T310 Burns involving less than 10% of body surface: Secondary | ICD-10-CM | POA: Insufficient documentation

## 2024-12-17 MED ORDER — OXYCODONE-ACETAMINOPHEN 5-325 MG PO TABS: 1.0000 | ORAL_TABLET | Freq: Four times a day (QID) | ORAL | 0 refills | Status: AC | PRN

## 2024-12-17 MED ORDER — BACITRACIN ZINC 500 UNIT/GM EX OINT: 1.0000 | TOPICAL_OINTMENT | Freq: Two times a day (BID) | CUTANEOUS | 0 refills | Status: AC

## 2024-12-17 MED ORDER — TETANUS-DIPHTH-ACELL PERTUSSIS 5-2-15.5 LF-MCG/0.5 IM SUSP
0.5000 mL | Freq: Once | INTRAMUSCULAR | Status: AC
  Administered 2024-12-17: 0.5 mL via INTRAMUSCULAR
  Filled 2024-12-17: qty 0.5

## 2024-12-17 MED ORDER — BACITRACIN ZINC 500 UNIT/GM EX OINT: TOPICAL_OINTMENT | Freq: Two times a day (BID) | CUTANEOUS | Status: DC
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
